# Patient Record
Sex: Female | Born: 1975 | Hispanic: Yes | Smoking: Never smoker
Health system: Southern US, Community
[De-identification: ages and names within clinical notes are randomized; demographics above are authoritative.]

## PROBLEM LIST (undated history)

## (undated) DIAGNOSIS — E119 Type 2 diabetes mellitus without complications: Secondary | ICD-10-CM

## (undated) HISTORY — PX: DILATION AND CURETTAGE OF UTERUS: SHX78

---

## 2001-02-22 ENCOUNTER — Encounter: Payer: Self-pay | Admitting: Unknown Physician Specialty

## 2001-02-22 ENCOUNTER — Ambulatory Visit (HOSPITAL_COMMUNITY): Admission: RE | Admit: 2001-02-22 | Discharge: 2001-02-22 | Payer: Self-pay | Admitting: Unknown Physician Specialty

## 2002-04-22 ENCOUNTER — Ambulatory Visit (HOSPITAL_COMMUNITY): Admission: RE | Admit: 2002-04-22 | Discharge: 2002-04-22 | Payer: Self-pay | Admitting: *Deleted

## 2002-04-22 ENCOUNTER — Encounter: Payer: Self-pay | Admitting: *Deleted

## 2002-08-24 ENCOUNTER — Inpatient Hospital Stay (HOSPITAL_COMMUNITY): Admission: AD | Admit: 2002-08-24 | Discharge: 2002-08-26 | Payer: Self-pay | Admitting: *Deleted

## 2004-10-06 ENCOUNTER — Emergency Department (HOSPITAL_COMMUNITY): Admission: EM | Admit: 2004-10-06 | Discharge: 2004-10-06 | Payer: Self-pay | Admitting: Emergency Medicine

## 2004-10-08 ENCOUNTER — Emergency Department (HOSPITAL_COMMUNITY): Admission: EM | Admit: 2004-10-08 | Discharge: 2004-10-08 | Payer: Self-pay | Admitting: Emergency Medicine

## 2006-12-17 ENCOUNTER — Ambulatory Visit (HOSPITAL_COMMUNITY): Admission: RE | Admit: 2006-12-17 | Discharge: 2006-12-17 | Payer: Self-pay | Admitting: Obstetrics and Gynecology

## 2007-03-25 ENCOUNTER — Other Ambulatory Visit: Admission: RE | Admit: 2007-03-25 | Discharge: 2007-03-25 | Payer: Self-pay | Admitting: Family Medicine

## 2007-04-06 ENCOUNTER — Inpatient Hospital Stay (HOSPITAL_COMMUNITY): Admission: AD | Admit: 2007-04-06 | Discharge: 2007-04-08 | Payer: Self-pay | Admitting: Gynecology

## 2007-04-06 ENCOUNTER — Ambulatory Visit: Payer: Self-pay | Admitting: Family Medicine

## 2008-06-20 ENCOUNTER — Other Ambulatory Visit: Admission: RE | Admit: 2008-06-20 | Discharge: 2008-06-20 | Payer: Self-pay | Admitting: Obstetrics & Gynecology

## 2008-08-04 ENCOUNTER — Ambulatory Visit (HOSPITAL_COMMUNITY): Admission: RE | Admit: 2008-08-04 | Discharge: 2008-08-04 | Payer: Self-pay | Admitting: Obstetrics and Gynecology

## 2008-09-27 ENCOUNTER — Inpatient Hospital Stay (HOSPITAL_COMMUNITY): Admission: AD | Admit: 2008-09-27 | Discharge: 2008-09-28 | Payer: Self-pay | Admitting: Obstetrics & Gynecology

## 2008-09-27 ENCOUNTER — Ambulatory Visit: Payer: Self-pay | Admitting: Family Medicine

## 2010-05-10 ENCOUNTER — Emergency Department (HOSPITAL_COMMUNITY): Admission: EM | Admit: 2010-05-10 | Discharge: 2010-05-10 | Payer: Self-pay | Admitting: Emergency Medicine

## 2010-12-06 NOTE — H&P (Signed)
Rhonda Hawkins, Rhonda Hawkins                         ACCOUNT NO.:  [id number]   MEDICAL RECORD NO.:  [id number]                   PATIENT TYPE:  INP   LOCATION:  LDR1                                 FACILITY:  [facility]   PHYSICIAN:  Langley Gauss, M.D.                DATE OF BIRTH:  [birth year]   DATE OF ADMISSION:  08/24/2002  DATE OF DISCHARGE:                                HISTORY & PHYSICAL   HISTORY OF PRESENT ILLNESS:  This is a 35 year old gravida 4, para 2 at [redacted]  weeks gestation is admitted for induction of labor secondary to psychosocial  factors.  The patient herself speaks minimal [place].  She does have her  sister available for interpreter purposes on a limited basis.  In addition,  the patient has 2 children at home who require child care arrangements.  Her  husband, Rhonda Hawkins, also works until 7 or 8 o'clock each day.  He is at work  at this point in time.  The patient is accompanied to the OB visit by her  sister.   The patient's prenatal course is noted to be uncomplicated.  She does have a  20 week ultrasound which confirms her due date which is also reliable based  upon her last menstrual period.  AFP triple screen within normal limits, B  positive blood type.   PAST OBSTETRICAL HISTORY:  The patient has 2 prior vaginal deliveries,  07/00, girl 7 pounds, 07/99 vaginal delivery of boy, 5 pounds.  She does  have one prior spontaneous AB, 02/03.  Previous care has been in [place].   SOCIAL HISTORY:  The patient is a nonsmoker.  Her sister is named Rhonda Hawkins, who provided the care for previous pregnancy.   CURRENT MEDICATIONS:  Prenatal vitamins only.   ALLERGIES:  The patient has no known drug allergies.  She is unemployed.   PHYSICAL EXAMINATION:  VITAL SIGNS:  Height is 4' 9 1/2, prepregnancy  weight 125, today's weight 154, blood pressure 122/87, pulse rate of 80,  respiratory rate is 20.  HEENT:  Negative.  No adenopathy.  NECK:  Supple, thyroid is nonpalpable.   No adenopathy.  LUNGS:  Clear.  CARDIOVASCULAR:  Regular rate and rhythm.  ABDOMEN:  Soft and nontender.  No surgical scars are identified.  She is a  vertex presentation by Leopold's maneuvers with a fundal height of 36 cm.  EXTREMITIES:  Noted to be normal.  PELVIC:  Normal external genitalia.  No lesions or ulcerations identified.  No leakage of fluid or vaginal bleeding identified.  Cervix noted to be 2 cm  dilated, 80% effaced, vertex is at 0 station and well applied to the cervix.  Fetal heart tones are auscultated at 150.   ASSESSMENT:  A 38 week intrauterine pregnancy in a multiparous patient.  Very favorable  cervix, thus the patient to be admitted  for induction of labor at this time.  We will proceed with amniotomy and  thereafter assess contraction pattern for adequacy and frequency of uterine  contractions.  The patient does plan on bottle-feeding.  Her husband is  planning on having a vasectomy for postpartum birth control purposes.  She  will be utilizing pediatrician on call.                                                Langley Gauss, M.D.    DC/MEDQ  D:  08/24/2002  T:  08/24/2002  Job:  [id number]

## 2010-12-06 NOTE — Op Note (Signed)
NAMEGEORGEANN, Rhonda Hawkins                         ACCOUNT NO.:  [id number]   MEDICAL RECORD NO.:  [id number]                   PATIENT TYPE:  INP   LOCATION:  A419                                 FACILITY:  [facility]   PHYSICIAN:  Langley Gauss, M.D.                DATE OF BIRTH:  [birth year]   DATE OF PROCEDURE:  08/24/2002  DATE OF DISCHARGE:  08/26/2002                                 OPERATIVE REPORT   DELIVERY NOTE   DIAGNOSIS:  A 38 week intrauterine pregnancy presenting in the early stages  of labor.   DELIVERY FORM:  Spontaneously assisted vaginal delivery 6 plus pound female  infant delivered over an intact perineum.   SURGEON:  Langley Gauss, M.D.   ANALGESIA FOR DELIVERY:  The patient received only IV Nubain during the  course of labor.   SPECIMENS:  Arterial cord gas and cord blood to pathology laboratory. The  placenta is examined and noted to be apparently intact with a three vessel  umbilical cord.   SUMMARY:  The patient was seen in the office at which time she was noted to  be 3 cm dilated. She was referred to [facility] at which time she  was noted to be having irregular uterine contractions. Amniotomy is  performed with a fetal scalp electrode, clear amniotic fluid is noted. The  patient had a reassuring fetal heart rate. Thereafter she entered active  labor pattern, requested only IV pain medication during the course of the  labor which provided good relief. She thereafter progressed rapidly along  the labor curve. She was a 9 cm dilatation at which time she had a strong  urge to push. She was managed expectantly and shortly thereafter examined at  which time she was noted to be completely dilated. She was thus placed in  the dorsal lithotomy position, prepped and draped in the usual sterile  manner at which time she pushed very well during her short second stage of  labor. Excellent perineal support is provided. No episiotomy or laceration  is noted to  occur. Following delivery of the head, the mouth and nares were  bulb suctioned of cleared amniotic fluid. Renewed expulsive efforts then  resulted in spontaneous rotation to a left anterior shoulder position.  Gentle down retraction combined with expulsive efforts resulted in delivery  of the anterior shoulder beneath the pubic symphysis without difficulty. The  remainder of the infant also delivered without difficulty and spontaneous  and vigorous breathing and cry is noted. The umbilical cord is then milked  towards the infant, the cord itself is clamped and cut and infant is handed  to the nursing personnel for immediate assessment. Arterial cord gas and  cord blood are then obtained from the umbilical cord. Gentle traction on the  umbilical cord resulted in separation which upon examination appears to be  an intact placenta with an intact three  vessel umbilical  cord. Examination of the genital tract reveals no  lacerations. The perineum is noted to be intact. Excellent uterine tone is  achieved with administration of a little IV Pitocin solution. The patient  tolerated the procedure very well. She was taken out of dorsal lithotomy  position and allowed to bond with the infant.                                                 Langley Gauss, M.D.    DC/MEDQ  D:  08/26/2002  T:  08/27/2002  Job:  [id number]

## 2010-12-06 NOTE — Discharge Summary (Signed)
   Rhonda Hawkins, Rhonda Hawkins                         ACCOUNT NO.:  [id number]   MEDICAL RECORD NO.:  [id number]                   PATIENT TYPE:  INP   LOCATION:  A419                                 FACILITY:  [facility]   PHYSICIAN:  Langley Gauss, M.D.                DATE OF BIRTH:  Sep 13, 1975   DATE OF ADMISSION:  08/24/2002  DATE OF DISCHARGE:  08/26/2002                                 DISCHARGE SUMMARY   DIAGNOSES:  1. Intrauterine pregnancy at 38+ weeks, presenting in labor.  2. Dysfunctional labor pattern with inadequate frequency and intensive     uterine contractions with Pitocin augmentation.  Delivery performed on     08/25/2002, spontaneous assisted vaginal delivery of a viable and vigorous     female infant, delivered over an intact perineum.   DISPOSITION:  AT time of discharge, the patient is given a copy of standard  discharge instructions.  She will follow up in the office in four weeks'  time.  Pertinently, the infant weighed 7 pounds 0.6 ounces, female infant.  The patient, per her request, did not desire infant circumcision to be  performed.   PERTINENT LABORATORY DATA:  Admission hemoglobin 10.6, hematocrit 32.2,  white count 9.5.  On postpartum day #1, hemoglobin 10.2, hematocrit 31.4,  white count 8.6.  B positive blood type.   HOSPITAL COURSE:  See previous dictation.  The patient required Pitocin  augmentation through the course of labor.  She received only IV narcotics  for pain relief. The patient progressed normally in long labor curve to well-  controlled atraumatic spontaneous vaginal delivery.   Postpartum the patient did very well.  She bonded well with the infant, had  no postpartum complications. Thus, she was discharged home on postpartum day  #1-1/2.  Pertinently, the patient was treated with Depo-Provera prior to  discharge.  Her husband will possibly be proceeding with performance of  vasectomy for permanent birth control purposes.                              Langley Gauss, M.D.    DC/MEDQ  D:  08/30/2002  T:  08/30/2002  Job:  [id number]

## 2011-02-07 ENCOUNTER — Observation Stay (HOSPITAL_COMMUNITY): Payer: Self-pay | Admitting: Anesthesiology

## 2011-02-07 ENCOUNTER — Encounter (HOSPITAL_COMMUNITY)
Admission: AD | Disposition: A | Discharge status: Home / Self Care | Payer: Self-pay | Source: Other Acute Inpatient Hospital | Attending: Obstetrics and Gynecology

## 2011-02-07 ENCOUNTER — Other Ambulatory Visit: Payer: Self-pay | Admitting: Obstetrics and Gynecology

## 2011-02-07 ENCOUNTER — Encounter (HOSPITAL_COMMUNITY): Payer: Self-pay | Admitting: Anesthesiology

## 2011-02-07 ENCOUNTER — Inpatient Hospital Stay (HOSPITAL_COMMUNITY)
Admission: AD | Admit: 2011-02-07 | Discharge: 2011-02-08 | DRG: 777 | Disposition: A | Discharge status: Home / Self Care | Payer: Self-pay | Source: Other Acute Inpatient Hospital | Attending: Obstetrics and Gynecology | Admitting: Obstetrics and Gynecology

## 2011-02-07 ENCOUNTER — Encounter (HOSPITAL_COMMUNITY): Payer: Self-pay | Admitting: General Practice

## 2011-02-07 DIAGNOSIS — O00109 Unspecified tubal pregnancy without intrauterine pregnancy: Principal | ICD-10-CM | POA: Diagnosis present

## 2011-02-07 DIAGNOSIS — Z302 Encounter for sterilization: Secondary | ICD-10-CM

## 2011-02-07 HISTORY — PX: LAPAROTOMY: SHX154

## 2011-02-07 HISTORY — PX: TUBAL LIGATION: SHX77

## 2011-02-07 LAB — CBC
Hemoglobin: 12 g/dL (ref 12.0–15.0)
MCHC: 34.8 g/dL (ref 30.0–36.0)
RBC: 4.07 MIL/uL (ref 3.87–5.11)
WBC: 9.2 10*3/uL (ref 4.0–10.5)

## 2011-02-07 LAB — DIFFERENTIAL
Basophils Relative: 0 % (ref 0–1)
Monocytes Relative: 6 % (ref 3–12)
Neutro Abs: 5 10*3/uL (ref 1.7–7.7)
Neutrophils Relative %: 55 % (ref 43–77)

## 2011-02-07 LAB — PREPARE RBC (CROSSMATCH)

## 2011-02-07 LAB — HCG, SERUM, QUALITATIVE: Preg, Serum: POSITIVE — AB

## 2011-02-07 SURGERY — LAPAROTOMY
Anesthesia: General | Wound class: Clean

## 2011-02-07 MED ORDER — GLYCOPYRROLATE 0.2 MG/ML IJ SOLN
INTRAMUSCULAR | Status: DC | PRN
  Administered 2011-02-07: .4 mg via INTRAVENOUS

## 2011-02-07 MED ORDER — HYDROCODONE-ACETAMINOPHEN 5-325 MG PO TABS
1.0000 | ORAL_TABLET | ORAL | Status: DC | PRN
  Administered 2011-02-07 – 2011-02-08 (×4): 2 via ORAL
  Filled 2011-02-07 (×5): qty 2

## 2011-02-07 MED ORDER — ZOLPIDEM TARTRATE 5 MG PO TABS: 10.0000 mg | ORAL_TABLET | Freq: Every day | ORAL | Status: DC

## 2011-02-07 MED ORDER — SUCCINYLCHOLINE CHLORIDE 20 MG/ML IJ SOLN
INTRAMUSCULAR | Status: DC | PRN
  Administered 2011-02-07: 140 mg via INTRAVENOUS

## 2011-02-07 MED ORDER — PANTOPRAZOLE SODIUM 40 MG PO TBEC
40.0000 mg | DELAYED_RELEASE_TABLET | Freq: Every day | ORAL | Status: DC
  Administered 2011-02-07: 40 mg via ORAL
  Filled 2011-02-07: qty 1

## 2011-02-07 MED ORDER — CEFAZOLIN SODIUM 1-5 GM-% IV SOLN
1.0000 g | Freq: Once | INTRAVENOUS | Status: DC
  Filled 2011-02-07: qty 50

## 2011-02-07 MED ORDER — PROPOFOL 10 MG/ML IV EMUL
INTRAVENOUS | Status: DC | PRN
  Administered 2011-02-07: 120 mg via INTRAVENOUS

## 2011-02-07 MED ORDER — PROPOFOL 10 MG/ML IV EMUL
INTRAVENOUS | Status: AC
  Filled 2011-02-07: qty 20

## 2011-02-07 MED ORDER — FENTANYL CITRATE 0.05 MG/ML IJ SOLN
INTRAMUSCULAR | Status: AC
  Filled 2011-02-07: qty 5

## 2011-02-07 MED ORDER — FENTANYL CITRATE 0.05 MG/ML IJ SOLN
INTRAMUSCULAR | Status: DC | PRN
  Administered 2011-02-07: 150 ug via INTRAVENOUS
  Administered 2011-02-07 (×2): 50 ug via INTRAVENOUS

## 2011-02-07 MED ORDER — ROCURONIUM BROMIDE 50 MG/5ML IV SOLN
INTRAVENOUS | Status: AC
  Filled 2011-02-07: qty 1

## 2011-02-07 MED ORDER — BUPIVACAINE-EPINEPHRINE PF 0.5-1:200000 % IJ SOLN
INTRAMUSCULAR | Status: AC
  Filled 2011-02-07: qty 10

## 2011-02-07 MED ORDER — LACTATED RINGERS IV SOLN
INTRAVENOUS | Status: AC
  Administered 2011-02-07: 09:00:00 via INTRAVENOUS

## 2011-02-07 MED ORDER — LIDOCAINE HCL (PF) 1 % IJ SOLN
INTRAMUSCULAR | Status: AC
  Filled 2011-02-07: qty 5

## 2011-02-07 MED ORDER — SODIUM CHLORIDE 0.9 % IJ SOLN
INTRAMUSCULAR | Status: AC
  Administered 2011-02-07: 03:00:00
  Filled 2011-02-07: qty 10

## 2011-02-07 MED ORDER — IBUPROFEN 600 MG PO TABS
600.0000 mg | ORAL_TABLET | Freq: Four times a day (QID) | ORAL | Status: DC | PRN
  Filled 2011-02-07: qty 1

## 2011-02-07 MED ORDER — ZOLPIDEM TARTRATE 5 MG PO TABS: 5.0000 mg | ORAL_TABLET | Freq: Every evening | ORAL | Status: DC | PRN

## 2011-02-07 MED ORDER — MIDAZOLAM HCL 2 MG/2ML IJ SOLN
INTRAMUSCULAR | Status: AC
  Filled 2011-02-07: qty 2

## 2011-02-07 MED ORDER — BUTORPHANOL TARTRATE 1 MG/ML IJ SOLN: 1.0000 mg | INTRAMUSCULAR | Status: DC | PRN

## 2011-02-07 MED ORDER — SUCCINYLCHOLINE CHLORIDE 20 MG/ML IJ SOLN
INTRAMUSCULAR | Status: AC
  Filled 2011-02-07: qty 1

## 2011-02-07 MED ORDER — NALBUPHINE HCL 10 MG/ML IJ SOLN
10.0000 mg | INTRAMUSCULAR | Status: DC | PRN
  Administered 2011-02-07 – 2011-02-08 (×3): 10 mg via INTRAVENOUS
  Filled 2011-02-07 (×4): qty 1

## 2011-02-07 MED ORDER — ONDANSETRON HCL 4 MG/2ML IJ SOLN: 4.0000 mg | Freq: Four times a day (QID) | INTRAMUSCULAR | Status: DC | PRN

## 2011-02-07 MED ORDER — HYDROMORPHONE HCL 1 MG/ML IJ SOLN: 1.0000 mg | Freq: Once | INTRAMUSCULAR | Status: DC

## 2011-02-07 MED ORDER — SODIUM CHLORIDE 0.9 % IV BOLUS (SEPSIS)
500.0000 mL | Freq: Once | INTRAVENOUS | Status: AC
  Administered 2011-02-07: 500 mL via INTRAVENOUS

## 2011-02-07 MED ORDER — KETOROLAC TROMETHAMINE 30 MG/ML IJ SOLN
30.0000 mg | Freq: Once | INTRAMUSCULAR | Status: AC
  Administered 2011-02-07: 30 mg via INTRAVENOUS

## 2011-02-07 MED ORDER — ONDANSETRON HCL 4 MG PO TABS: 4.0000 mg | ORAL_TABLET | Freq: Four times a day (QID) | ORAL | Status: DC | PRN

## 2011-02-07 MED ORDER — GLYCOPYRROLATE 0.2 MG/ML IJ SOLN
INTRAMUSCULAR | Status: AC
  Filled 2011-02-07: qty 1

## 2011-02-07 MED ORDER — NEOSTIGMINE METHYLSULFATE 1 MG/ML IJ SOLN
INTRAMUSCULAR | Status: DC | PRN
  Administered 2011-02-07: 3 mg via INTRAMUSCULAR

## 2011-02-07 MED ORDER — MIDAZOLAM HCL 5 MG/5ML IJ SOLN
INTRAMUSCULAR | Status: DC | PRN
  Administered 2011-02-07: 2 mg via INTRAVENOUS

## 2011-02-07 MED ORDER — SODIUM CHLORIDE 0.9 % IV SOLN
INTRAVENOUS | Status: DC
  Administered 2011-02-07: 02:00:00 via INTRAVENOUS

## 2011-02-07 MED ORDER — LIDOCAINE HCL 1 % IJ SOLN
INTRAMUSCULAR | Status: DC | PRN
  Administered 2011-02-07: 50 mg via INTRADERMAL

## 2011-02-07 MED ORDER — KETOROLAC TROMETHAMINE 30 MG/ML IJ SOLN
30.0000 mg | Freq: Once | INTRAMUSCULAR | Status: AC
  Filled 2011-02-07: qty 1

## 2011-02-07 MED ORDER — ROCURONIUM BROMIDE 100 MG/10ML IV SOLN
INTRAVENOUS | Status: DC | PRN
  Administered 2011-02-07: 30 mg via INTRAVENOUS

## 2011-02-07 MED ORDER — CEFAZOLIN SODIUM 1-5 GM-% IV SOLN
INTRAVENOUS | Status: DC | PRN
  Administered 2011-02-07: 1 g via INTRAVENOUS

## 2011-02-07 MED ORDER — NEOSTIGMINE METHYLSULFATE 1 MG/ML IJ SOLN
INTRAMUSCULAR | Status: AC
  Filled 2011-02-07: qty 10

## 2011-02-07 MED ORDER — BUPIVACAINE-EPINEPHRINE PF 0.5-1:200000 % IJ SOLN
INTRAMUSCULAR | Status: DC | PRN
  Administered 2011-02-07: 20 mL

## 2011-02-07 MED ORDER — KETOROLAC TROMETHAMINE 30 MG/ML IJ SOLN
INTRAMUSCULAR | Status: AC
  Filled 2011-02-07: qty 1

## 2011-02-07 MED ORDER — SODIUM CHLORIDE 0.9 % IJ SOLN
INTRAMUSCULAR | Status: AC
  Administered 2011-02-07: 03:00:00
  Filled 2011-02-07: qty 3

## 2011-02-07 MED ORDER — LACTATED RINGERS IV SOLN
INTRAVENOUS | Status: DC | PRN
  Administered 2011-02-07 (×2): via INTRAVENOUS

## 2011-02-07 MED ORDER — CEFAZOLIN SODIUM 1 G IJ SOLR
INTRAMUSCULAR | Status: AC
  Filled 2011-02-07: qty 10

## 2011-02-07 SURGICAL SUPPLY — 44 items
BAG HAMPER (MISCELLANEOUS) ×3 IMPLANT
CELLS DAT CNTRL 66122 CELL SVR (MISCELLANEOUS) IMPLANT
CLOTH BEACON ORANGE TIMEOUT ST (SAFETY) ×3 IMPLANT
COVER LIGHT HANDLE STERIS (MISCELLANEOUS) ×4 IMPLANT
DRAPE WARM FLUID 44X44 (DRAPE) ×3 IMPLANT
DRESSING TELFA 8X3 (GAUZE/BANDAGES/DRESSINGS) ×3 IMPLANT
DURAPREP 26ML APPLICATOR (WOUND CARE) ×3 IMPLANT
ELECT REM PT RETURN 9FT ADLT (ELECTROSURGICAL) ×3
ELECTRODE REM PT RTRN 9FT ADLT (ELECTROSURGICAL) ×2 IMPLANT
FORMALIN 10 PREFIL 480ML (MISCELLANEOUS) ×3 IMPLANT
GLOVE BIOGEL PI IND STRL 9 (GLOVE) ×1 IMPLANT
GLOVE BIOGEL PI INDICATOR 9 (GLOVE) ×1
GLOVE ECLIPSE 6.5 STRL STRAW (GLOVE) ×3 IMPLANT
GLOVE ECLIPSE 9.0 STRL (GLOVE) ×3 IMPLANT
GLOVE INDICATOR 7.0 STRL GRN (GLOVE) ×3 IMPLANT
GLOVE INDICATOR STER SZ 9 (GLOVE) ×3 IMPLANT
GOWN BRE IMP SLV AUR XL STRL (GOWN DISPOSABLE) ×3 IMPLANT
GOWN STRL REIN 3XL LVL4 (GOWN DISPOSABLE) ×3 IMPLANT
INST SET MAJOR GENERAL (KITS) ×3 IMPLANT
KIT ROOM TURNOVER APOR (KITS) ×3 IMPLANT
MANIFOLD NEPTUNE II (INSTRUMENTS) ×3 IMPLANT
NEEDLE HYPO 25X1 1.5 SAFETY (NEEDLE) ×3 IMPLANT
PACK ABDOMINAL MAJOR (CUSTOM PROCEDURE TRAY) ×3 IMPLANT
PAD ARMBOARD 7.5X6 YLW CONV (MISCELLANEOUS) ×3 IMPLANT
RETRACTOR WND ALEXIS 25 LRG (MISCELLANEOUS) IMPLANT
RTRCTR WOUND ALEXIS 18CM MED (MISCELLANEOUS)
RTRCTR WOUND ALEXIS 25CM LRG (MISCELLANEOUS)
SET BASIN LINEN APH (SET/KITS/TRAYS/PACK) ×3 IMPLANT
SOL PREP PROV IODINE SCRUB 4OZ (MISCELLANEOUS) IMPLANT
SPONGE GAUZE 4X4 12PLY (GAUZE/BANDAGES/DRESSINGS) IMPLANT
STAPLER VISISTAT 35W (STAPLE) ×3 IMPLANT
SUCTION POOLE TIP (SUCTIONS) IMPLANT
SUT CHROMIC 0 CT 1 (SUTURE) ×3 IMPLANT
SUT CHROMIC 2 0 CT 1 (SUTURE) ×6 IMPLANT
SUT PLAIN CT 1/2CIR 2-0 27IN (SUTURE) ×6 IMPLANT
SUT VIC AB 0 CT1 27 (SUTURE) ×3
SUT VIC AB 0 CT1 27XBRD ANTBC (SUTURE) IMPLANT
SUT VIC AB 4-0 PS2 27 (SUTURE) ×3 IMPLANT
SUT VICRYL AB BRD 2-0 54IN (SUTURE) IMPLANT
SYR CONTROL 10ML LL (SYRINGE) ×1 IMPLANT
TOWEL BLUE STERILE X RAY DET (MISCELLANEOUS) ×3 IMPLANT
TOWEL OR 17X26 4PK STRL BLUE (TOWEL DISPOSABLE) ×3 IMPLANT
TRAY FOLEY BAG SILVER LF 14FR (CATHETERS) ×3 IMPLANT
TRAY FOLEY CATH 16FR SILVER (SET/KITS/TRAYS/PACK) IMPLANT

## 2011-02-07 NOTE — Anesthesia Preprocedure Evaluation (Signed)
Anesthesia Evaluation  Name, MR# and DOB Patient awake  General Assessment Comment  Reviewed: Allergy & Precautions and reviewed documented beta blocker date and time   Airway Mallampati: III TM Distance: <3 FB Neck ROM: Full  Mouth opening: Limited Mouth Opening  Dental No notable dental hx (+) Teeth Intact   Pulmonaryneg pulmonary ROS    clear to auscultation  pulmonary exam normal   Cardiovascular Exercise Tolerance: Good Regular Normal   Neuro/PsychNegative Neurological ROS Negative Psych ROS  GI/Hepatic/Renal negative Liver ROS, and negative Renal ROS (+)  GERD Controlled     Endo/Other  Negative Endocrine ROS (+)   Abdominal Normal abdominal exam  (+)  Abdomen: soft. Bowel sounds: normal.  Musculoskeletal negative musculoskeletal ROS (+)  Hematology negative hematology ROS (+)   Peds negative pediatric ROS (+)  Reproductive/Obstetrics negative OB ROS   Anesthesia Other Findings             Anesthesia Physical Anesthesia Plan  ASA: II and Emergent  Anesthesia Plan: General   Post-op Pain Management:    Induction: Intravenous, Rapid sequence and Cricoid pressure planned  Airway Management Planned: Oral ETT  Additional Equipment:   Intra-op Plan:   Post-operative Plan: Extubation in OR  Informed Consent: I have reviewed the patients History and Physical, chart, labs and discussed the procedure including the risks, benefits and alternatives for the proposed anesthesia with the patient or authorized representative who has indicated his/her understanding and acceptance.   Dental advisory given  Plan Discussed with: CRNA, Anesthesiologist ([facility]) and Surgeon  Anesthesia Plan Comments: (glidesdcope avaible)        Anesthesia Quick Evaluation

## 2011-02-07 NOTE — Progress Notes (Signed)
02/07/11 0408 Patient sent to OR in stable condition.  VSS.  No problems noted.

## 2011-02-07 NOTE — Brief Op Note (Signed)
02/07/2011  5:35 AM  PATIENT:  Rhonda Hawkins  35 y.o. female  PRE-OPERATIVE DIAGNOSIS:  ectopic tubal pregnancy  POST-OPERATIVE DIAGNOSIS:  ectopic tubal pregnancy  PROCEDURE:  Procedure(s): LAPAROTOMY BILATERAL TUBAL LIGATION  SURGEON:  Surgeon(s): Burnard Bunting  PHYSICIAN ASSISTANT:   ASSISTANTS: na    ANESTHESIA:   general  ESTIMATED BLOOD LOSS: * No blood loss amount entered *   BLOOD ADMINISTERED:none  DRAINS: none   LOCAL MEDICATIONS USED:  MARCAINE 20CC  SPECIMEN:  Source of Specimen:  tubes  DISPOSITION OF SPECIMEN:  PATHOLOGY  COUNTS:  YES  TOURNIQUET:  * No tourniquets in log *  DICTATION #:   PLAN OF CARE:   PATIENT DISPOSITION:  PACU - hemodynamically stable.   Delay start of Pharmacological VTE agent (>24hrs) due to surgical blood loss or risk of bleeding:  yes        02/07/2011  5:21 AM  PATIENT:  Rhonda Hawkins  35 y.o. female  PRE-OPERATIVE DIAGNOSIS:  ectopic tubal pregnancy, right,                                                      desire for permanent sterilization  POST-OPERATIVE DIAGNOSIS:  ectopic tubal pregnancy desire for permanent sterilization  PROCEDURE:  Procedure(s): LAPAROTOMY, Right salpingectomy for ectopic, left salpingectomy(sterilization)  SURGEON:  Surgeon(s): Burnard Bunting  PHYSICIAN ASSISTANT:   ASSISTANTS: Cecile Sheerer, RN FA   ANESTHESIA:   general  ESTIMATED BLOOD LOSS: * No blood loss amount entered *   BLOOD ADMINISTERED:none  DRAINS: none   LOCAL MEDICATIONS USED:  MARCAINE 20CC  SPECIMEN:  Source of Specimen:  bilateral fallopian tubes  DISPOSITION OF SPECIMEN:  PATHOLOGY  COUNTS:  YES  TOURNIQUET:  * No tourniquets in log *  DICTATION #: [id number]  PLAN OF CARE: admit x 24 hours  PATIENT DISPOSITION:  PACU - hemodynamically stable.   Delay start of Pharmacological VTE agent (>24hrs) due to surgical blood loss or risk of bleeding:   yes Early ambulation planned.

## 2011-02-07 NOTE — Transfer of Care (Signed)
Immediate Anesthesia Transfer of Care Note  Patient: Rhonda Hawkins  Procedure(s) Performed:  LAPAROTOMY  Patient Location: PACU  Anesthesia Type: General  Level of Consciousness: sedated  Airway & Oxygen Therapy: Patient Spontanous Breathing and Patient connected to face mask oxygen  Post-op Assessment: Report given to PACU RN, Post -op Vital signs reviewed and stable and Patient moving all extremities  Post vital signs: stable  Complications: No apparent anesthesia complications

## 2011-02-07 NOTE — Op Note (Signed)
NAMECHARO, Rhonda Hawkins             ACCOUNT NO.:  [id number]  MEDICAL RECORD NO.:  [id number]  LOCATION:  A320                          FACILITY:  [facility]  PHYSICIAN:  Tilda Burrow, M.D. DATE OF BIRTH:  18-Dec-1975  DATE OF PROCEDURE:  02/07/2011 DATE OF DISCHARGE:                              OPERATIVE REPORT   PREOPERATIVE DIAGNOSES:  Right ectopic pregnancy, desire for elective permanent sterilization.  PROCEDURE:  Right salpingectomy for ectopic, left salpingectomy sterilization (bilateral salpingectomy).  SURGEON:  Tilda Burrow, MD  ASSISTANT:  Dr. Annabell Howells  ANESTHESIA:  General.  COMPLICATIONS:  None.  FINDINGS:  300 mL of blood and clots inside the pelvis, right ectopic pregnancy with ongoing bleeding from distal tube, normal ovaries bilaterally.  INDICATIONS:  A 35 year old with gravida 7, para 5-0-1-5 with a positive HCG 8000 and ultrasound confirmation of extrauterine pregnancy at [facility], the patient of [facility] transferred here for evaluation.  Upon arrival, the patient's blood pressures had changed from reported values at [facility], when combined with pain and was taken promptly to the OR.  DETAILS OF PROCEDURE:  The patient was taken to the operating room after consents obtained and confirmed with a translator service over the phone.  Permanent sterilization was specifically requested and initiated by the patient.  Permanency of sterilization requested and reviewed with the patient.  Procedure risks and potential complications were reviewed through a translator.  The patient had time-out conducted in the room, Ancef 1 g IV administered and then the lower abdominal transverse incision made approximately 10 cm in length in method of Pfannenstiel.  The fascia was opened transversely, peritoneal cavity opened and entered bluntly in the midline.  Generous amount of blood and clots was encountered and sucked and suctioned out.  The patient had an  easily identifiable distended isthmic midportion of the right tube involved in ectopic pregnancy. Babcock clamp was used to elevate the tube.  The salpingectomy was performed using Kelly clamps, transecting the mesosalpinx and then ligating it in segments using 2-0 chromic.  Tubal stump at the insertion into the uterine body was crossclamped with a Kelly clamp and ligated. Hemostasis was good.  On the left side, since the patient desired permanent sterilization tube was inspected.  There was some irregularity to the distal fimbria on that side.  It almost looked like there had been a previous damage to the distal fimbria on that side.  In order to avoid potential complications such as hydrosalpinx, a salpingectomy was then performed.  Similar technique was used on the left side. Hemostasis was again satisfactory.  Ovaries were grossly normal.  Pelvis was irrigated with 1000 mL of saline solution and cleared significantly. Anterior laparotomy equipment was removed, anterior peritoneum closed with some difficulty using 2-0 chromic, then the fascia closed with running 0 Vicryl and then a single subcutaneous 2-0 Vicryl suture placed in the fatty tissue for reapproximation and then subcuticular 4-0 Vicryl closure of the skin incision performed.  Sponge and needle counts correct.  The patient tolerated the procedure well and went to recovery room.  Foley catheter was left in place.     Tilda Burrow, M.D.     JVF/MEDQ  D:  02/07/2011  T:  02/07/2011  Job:  [id number]

## 2011-02-07 NOTE — Anesthesia Procedure Notes (Signed)
Procedure Name: Intubation Date/Time: 02/07/2011 4:20 AM Performed by: Despina Hidden Pre-anesthesia Checklist: Patient identified, Emergency Drugs available, Suction available, Timeout performed and Patient being monitored Patient Re-evaluated:Patient Re-evaluated prior to inductionOxygen Delivery Method: Circle System Utilized Preoxygenation: Pre-oxygenation with 100% oxygen Intubation Type: IV induction, Rapid sequence and Circoid Pressure applied Ventilation: Mask ventilation without difficulty Laryngoscope Size: Mac and 3 Grade View: Grade I Tube type: Oral Tube size: 7.0 mm Number of attempts: 1 Airway Equipment and Method: stylet Placement Confirmation: ETT inserted through vocal cords under direct vision,  positive ETCO2 and breath sounds checked- equal and bilateral Secured at: 22 cm Tube secured with: Tape Dental Injury: Teeth and Oropharynx as per pre-operative assessment

## 2011-02-07 NOTE — H&P (Signed)
Subjective:    Patient is a 35 y.o. female scheduled for laparotomy for removal of right ectopic, and for left tubal  ligation. Indications for procedure are that right ectopic has been diagnosed, and patient specifically requests permanent sterilization at time of surgery.  She is a G7 [id number]  Onset of symptoms was 1 dayago. Symptoms have been gradually worsening since that time. The pain is located in the right lower abdomen. Patient describes the pain as 8 of 10, continuous and rated as severe. Pain has been associated with normal activities. Patient denies any food since 6 PM on 7/19. Symptoms are aggravated by movement. Symptoms improve with n/a. Past history includes 5 prior healthy pregnancies. Previous studies include ultrasound at [facility] revealing an empty uterus, and a 3+ cm mass in right adnexa with some free fluid.  Vital signs have changed since referrral values. Pulse is now in high 90's, and last BP 120/60's , changing from prior values.  Hct 34, was 39 at [facility]  Pertinent Gynecological History: No prior gyn problems  Menses: pregnant Bleeding: light vag bleeding tonite Contraception: none DES exposure: denies Blood transfusions: none Sexually transmitted diseases: no past history Preventive screening: n/a Last mammogram: n/a Date: n/a Last pap: normal Date : n/a  Discussed Blood/Blood Products: yes  Menstrual History: OB History    Grav Para Term Preterm Abortions TAB SAB Ect Mult Living                  Menarche age: n/a Patient's last menstrual period was 12/16/2010.      Review of Systems Pertinent items are noted in HPI.    Objective:    LMP 12/16/2010  General:   alert and mild distress  Skin:   normal  HEENT:  PERRLA  Lungs:   clear to auscultation bilaterally  Heart:   regular rate and rhythm  Breasts:   normal without suspicious masses, skin or nipple changes or axillary nodes, self-exam is taught and encouraged and defer  Abdomen:   abnormal findings:  guarding  Pelvis:  Exam deferred.  Ultrasound shows empty uterus and right adnexal mass and free fluid       FHT: n/a BPM  Uterine Size: n/a  Presentation: na  Cervix:             Dilation: na      Effacement: na            Station:  na    Consistency: na           Position: na     Assessment: Right ectopic pregnancy, leaking or ruptured, with bleeding . Desire for permanent sterilization Desire for      To operating room for right salpingectomy, left tubal ligation      Plan: to OR tonight    Counseling: Procedure, risks, reasons, benefits and complications (including injury to bowel, bladder, major blood vessel, ureter, bleeding, possibility of transfusion, infection, or fistula formation) reviewed in detail. Consent signed. Preop testing ordered. Instructions reviewed, including NPO after 6 pm yesterday.

## 2011-02-07 NOTE — Plan of Care (Signed)
Problem: Consults Goal: General Medical Patient Education See Patient Education Module for specific education. Outcome: Progressing Ectopic pregnancy

## 2011-02-07 NOTE — Brief Op Note (Signed)
02/07/2011  5:21 AM  PATIENT:  Rhonda Hawkins  35 y.o. female  PRE-OPERATIVE DIAGNOSIS:  ectopic tubal pregnancy, right,                                                      desire for permanent sterilization  POST-OPERATIVE DIAGNOSIS:  ectopic tubal pregnancy desire for permanent sterilization  PROCEDURE:  Procedure(s): LAPAROTOMY, Right salpingectomy for ectopic, left salpingectomy(sterilization)  SURGEON:  Surgeon(s): Burnard Bunting  PHYSICIAN ASSISTANT:   ASSISTANTS: Cecile Sheerer, RN FA   ANESTHESIA:   general  ESTIMATED BLOOD LOSS: * No blood loss amount entered *   BLOOD ADMINISTERED:none  DRAINS: none   LOCAL MEDICATIONS USED:  MARCAINE 20CC  SPECIMEN:  Source of Specimen:  bilateral fallopian tubes  DISPOSITION OF SPECIMEN:  PATHOLOGY  COUNTS:  YES  TOURNIQUET:  * No tourniquets in log *  DICTATION #: [id number]  PLAN OF CARE: admit x 24 hours  PATIENT DISPOSITION:  PACU - hemodynamically stable.   Delay start of Pharmacological VTE agent (>24hrs) due to surgical blood loss or risk of bleeding:  yes Early ambulation planned.

## 2011-02-07 NOTE — Anesthesia Postprocedure Evaluation (Signed)
  Anesthesia Post-op Note  Patient: Rhonda Hawkins  Procedure(s) Performed:  LAPAROTOMY; BILATERAL TUBAL LIGATION - Bilateral salpingectomy  Patient Location: PACU  Anesthesia Type: General  Level of Consciousness: awake, oriented and patient cooperative  Airway and Oxygen Therapy: Patient Spontanous Breathing and Patient connected to face mask  Post-op Pain: mild  Post-op Assessment: Post-op Vital signs reviewed, Patient's Cardiovascular Status Stable, Respiratory Function Stable, Patent Airway and No signs of Nausea or vomiting  Post-op Vital Signs: stable  Complications: No apparent anesthesia complications

## 2011-02-08 MED ORDER — HYDROCODONE-ACETAMINOPHEN 5-325 MG PO TABS: 1.0000 | ORAL_TABLET | ORAL | Status: AC | PRN

## 2011-02-08 NOTE — Progress Notes (Signed)
1 Day Post-Op Procedure(s): LAPAROTOMY  For ectopic with Right Salpingectomy for ectopic, left salpingectomy for requested sterilization  BILATERAL TUBAL LIGATION  Subjective: Patient reports tolerating PO, + flatus and no problems voiding.    Objective: I have reviewed patient's vital signs.BP 96/63  Pulse 86  Temp(Src) 98.3 F (36.8 C) (Oral)  Resp 18  Ht 5\' 1"  (1.549 m)  Wt 161 lb 9.6 oz (73.3 kg)  BMI 30.53 kg/m2  SpO2 96%  LMP 12/16/2010   abdomen soft, incision intact, no erythema, no distention  Assessment: s/p Procedure(s):Right Salpingectomy for ectopic, Left salpingectomy for sterilization via LAPAROTOMY BILATERAL TUBAL LIGATION: stable  Plan: Discharge home  LOS: 1 day    Dezmen Alcock V 02/08/2011, 10:19 AM

## 2011-02-08 NOTE — Discharge Summary (Signed)
  Patient with uncomplicated postop course after surgery.  See postop day one progress note for detail.

## 2011-02-08 NOTE — Discharge Summary (Signed)
  Discharge Instructions  The hospital and staff members extend their best wishes to you as you are discharged. Because we are most concerned with your health, we suggest you carefully read the following instructions.  Activity Instructions    Restrictions: no heavy lifting x 2 weeks over 15 lbs    Diet: regular  Urination:   Drain/Tubes:   Bowels:      Pain Assessment  Pain Score:   1  Duration:    Current pain management regimen- Instructed: yes  IF PAIN INTENSIFIES OR PAIN IS UNRELIEVED WITH MEDICATIONS AS ORDERED, CALL YOUR DOCTOR - Instructed: yes [phone] ____________________________________________________________________________  Remember to contact your doctor for follow-up as instructed  Follow-up to Dr . Emelda Fear in 2 week  Check with your doctor regarding any labs, xrays, or studies that have not received final results. If you have problems related to your current illness/procedures or if your symptoms persist or worsen, please contact your doctor. ____________________________________________________________________________  Individual Instructions/Custom Documents/Teaching Sheet Use this area to list any individualized instructions, appointment, continued therapies, etc, that the patient/family is to continue after discharge. If additional space is needed, use "Additional Instructions"  Additional Instructions related to continuing or unresolved problems  Weight Management is important to your health. Call your physician if you experience sudden weight loss or weight gain.  Smoking is hazardous to your health. Second hand smoke is hazardous to those around you. If you smoke, you can contact your physician for smoking cessation information and classes.  IV Sites - notify your doctor for any extreme redness or observed drainage from any old IV site.

## 2011-02-08 NOTE — Progress Notes (Signed)
PIV removed without complaint, patient hemodynamically stable upon discharge. Patient verbalizes understanding of discharge instructions, prescriptions and follow up appointments. Patient escorted out by CNA, transported by family.

## 2011-02-10 LAB — TYPE AND SCREEN: Unit division: 0

## 2011-02-11 NOTE — Op Note (Signed)
See dictation [id number] noted in brief op note

## 2011-02-17 ENCOUNTER — Encounter (HOSPITAL_COMMUNITY): Payer: Self-pay | Admitting: Obstetrics and Gynecology

## 2011-05-01 LAB — URINALYSIS, ROUTINE W REFLEX MICROSCOPIC
Glucose, UA: NEGATIVE
Leukocytes, UA: NEGATIVE
Protein, ur: NEGATIVE
Specific Gravity, Urine: 1.01
pH: 7

## 2011-05-01 LAB — RPR: RPR Ser Ql: NONREACTIVE

## 2011-05-01 LAB — URINE MICROSCOPIC-ADD ON

## 2011-05-01 LAB — CBC
MCHC: 33.8
RBC: 4.54

## 2013-01-04 ENCOUNTER — Emergency Department (HOSPITAL_COMMUNITY)
Admission: EM | Admit: 2013-01-04 | Discharge: 2013-01-04 | Disposition: A | Discharge status: Home / Self Care | Payer: Self-pay | Attending: Emergency Medicine | Admitting: Emergency Medicine

## 2013-01-04 ENCOUNTER — Encounter (HOSPITAL_COMMUNITY): Payer: Self-pay | Admitting: Emergency Medicine

## 2013-01-04 DIAGNOSIS — J029 Acute pharyngitis, unspecified: Secondary | ICD-10-CM | POA: Insufficient documentation

## 2013-01-04 DIAGNOSIS — R509 Fever, unspecified: Secondary | ICD-10-CM | POA: Insufficient documentation

## 2013-01-04 DIAGNOSIS — R52 Pain, unspecified: Secondary | ICD-10-CM | POA: Insufficient documentation

## 2013-01-04 DIAGNOSIS — R000 Tachycardia, unspecified: Secondary | ICD-10-CM | POA: Insufficient documentation

## 2013-01-04 DIAGNOSIS — R21 Rash and other nonspecific skin eruption: Secondary | ICD-10-CM | POA: Insufficient documentation

## 2013-01-04 LAB — RAPID STREP SCREEN (MED CTR MEBANE ONLY): Streptococcus, Group A Screen (Direct): NEGATIVE

## 2013-01-04 MED ORDER — PREDNISONE 10 MG PO TABS: ORAL_TABLET | ORAL | Status: DC

## 2013-01-04 MED ORDER — DIPHENHYDRAMINE HCL 25 MG PO TABS: 25.0000 mg | ORAL_TABLET | Freq: Four times a day (QID) | ORAL | Status: DC

## 2013-01-04 MED ORDER — LORATADINE 10 MG PO TABS
10.0000 mg | ORAL_TABLET | Freq: Every day | ORAL | Status: DC
  Administered 2013-01-04: 10 mg via ORAL
  Filled 2013-01-04: qty 1

## 2013-01-04 MED ORDER — PREDNISONE 20 MG PO TABS
40.0000 mg | ORAL_TABLET | Freq: Once | ORAL | Status: AC
  Administered 2013-01-04: 40 mg via ORAL
  Filled 2013-01-04: qty 2

## 2013-01-04 NOTE — ED Provider Notes (Signed)
Medical screening examination/treatment/procedure(s) were performed by non-physician practitioner and as supervising physician I was immediately available for consultation/collaboration.  Angeleigh Chiasson R. Shelbia Scinto, MD 01/04/13 2323 

## 2013-01-04 NOTE — ED Notes (Signed)
Itching rash to thighs, for 2 weeks.  No new soaps or detergent.  Face  And scalp itch, but no rash present.

## 2013-01-04 NOTE — ED Provider Notes (Signed)
History     CSN: [id number]  Arrival date & time 01/04/13  1627   First MD Initiated Contact with Patient 01/04/13 1800      Chief Complaint  Patient presents with  . Rash    (Consider location/radiation/quality/duration/timing/severity/associated sxs/prior treatment) Patient is a 37 y.o. female presenting with rash. The history is provided by the patient.  Rash Pain location:  Generalized Associated symptoms: fever and sore throat   Associated symptoms: no chest pain, no nausea, no shortness of breath and no vomiting    Rhonda Hawkins is a 37 y.o. female who presents to the ED with a rash. The rash has been ongoing x 2 weeks. The itching has gotten really bad. Last night she started fever and sore throat. She denies nausea, vomiting or other problems.  History reviewed. No pertinent past medical history.  Past Surgical History  Procedure Laterality Date  . Dilation and curettage of uterus    . Laparotomy  02/07/2011    Procedure: LAPAROTOMY;  Surgeon: Tilda Burrow, MD;  Location: [facility];  Service: Gynecology;  Laterality: N/A;  . Tubal ligation  02/07/2011    Procedure: BILATERAL TUBAL LIGATION;  Surgeon: Tilda Burrow, MD;  Location: [facility];  Service: Gynecology;  Laterality: Bilateral;  Bilateral salpingectomy    Family History  Problem Relation Age of Onset  . Anesthesia problems Mother     difficulty awakening after anesthesia  . Anesthesia problems Sister     nausea and vomiting    History  Substance Use Topics  . Smoking status: Never Smoker   . Smokeless tobacco: Not on file  . Alcohol Use: No    OB History   Grav Para Term Preterm Abortions TAB SAB Ect Mult Living                  Review of Systems  Constitutional: Positive for fever. Negative for appetite change.  HENT: Positive for sore throat. Negative for congestion, trouble swallowing and neck pain.   Eyes: Negative for pain.  Respiratory: Negative for chest tightness and shortness of  breath.   Cardiovascular: Negative for chest pain.  Gastrointestinal: Negative for nausea, vomiting and abdominal pain.  Skin: Positive for rash.  Allergic/Immunologic: Negative for immunocompromised state.  Neurological: Negative for headaches.  Psychiatric/Behavioral: The patient is not nervous/anxious.     Allergies  Review of patient's allergies indicates no known allergies.  Home Medications   Current Outpatient Rx  Name  Route  Sig  Dispense  Refill  . acetaminophen (TYLENOL) 500 MG tablet   Oral   Take 1,500 mg by mouth every 4 (four) hours as needed. For pain         . Bisacodyl (LAXATIVE PO)   Oral   Take 1 tablet by mouth every 4 (four) hours. For constipation         . ibuprofen (ADVIL,MOTRIN) 200 MG tablet   Oral   Take 200 mg by mouth every 6 (six) hours as needed. For pain            BP 133/76  Pulse 114  Temp(Src) 98.2 F (36.8 C)  Resp 18  Wt 154 lb (69.854 kg)  BMI 29.11 kg/m2  SpO2 100%  LMP 12/05/2012  Physical Exam  Nursing note and vitals reviewed. Constitutional: She is oriented to person, place, and time. She appears well-developed and well-nourished. No distress.  HENT:  Head: Normocephalic and atraumatic.  Eyes: EOM are normal.  Neck: Normal range of  motion. Neck supple.  Cardiovascular: Tachycardia present.   Pulmonary/Chest: Effort normal and breath sounds normal. She has no wheezes.  Abdominal: Soft. There is no tenderness.  Musculoskeletal: Normal range of motion.  Lymphadenopathy:    She has no cervical adenopathy.  Neurological: She is alert and oriented to person, place, and time. No cranial nerve deficit.  Skin: Rash noted.  There is a rash noted on the arms, legs, buttocks and trunk. The patient is scratching continuously. There are hive like areas noted and a few papular areas.   Psychiatric: She has a normal mood and affect. Her behavior is normal.   Results for orders placed during the hospital encounter of 01/04/13  (from the past 24 hour(s))  RAPID STREP SCREEN     Status: None   Collection Time    01/04/13  6:05 PM      Result Value Range   Streptococcus, Group A Screen (Direct) NEGATIVE  NEGATIVE    ED Course  Procedures (including critical care time)   MDM  37 y.o. female with generalized rash unknown cause most likely allergic dermitis. Will treat with prednisone and Benadryl. She will follow up with dermatology if symptoms persist. Discussed with patient in detail to be observant of anything she may eat that causes the rash to get worse or anything new that causes itching. Patient voices understanding.  Discussed with the patient lab and clinical findings and plan of care. All questioned fully answered. She will return if any problems arise.    Medication List    TAKE these medications       diphenhydrAMINE 25 MG tablet  Commonly known as:  BENADRYL  Take 1 tablet (25 mg total) by mouth every 6 (six) hours.     predniSONE 10 MG tablet  Commonly known as:  DELTASONE  Take two tablets by mouth two times a day starting tomorrow. (01/05/13)      ASK your doctor about these medications       acetaminophen 500 MG tablet  Commonly known as:  TYLENOL  Take 1,000 mg by mouth every 4 (four) hours as needed. For pain     ibuprofen 200 MG tablet  Commonly known as:  ADVIL,MOTRIN  Take 200 mg by mouth every 6 (six) hours as needed. For pain            Janne Napoleon, NP 01/04/13 1906

## 2013-01-04 NOTE — ED Notes (Signed)
Pt c/o generalized itching rash x 2 weeks.

## 2014-04-27 ENCOUNTER — Ambulatory Visit: Payer: Self-pay

## 2014-10-10 ENCOUNTER — Emergency Department (HOSPITAL_COMMUNITY): Payer: Self-pay

## 2014-10-10 ENCOUNTER — Emergency Department (HOSPITAL_COMMUNITY)
Admission: EM | Admit: 2014-10-10 | Discharge: 2014-10-10 | Disposition: A | Discharge status: Home / Self Care | Payer: Self-pay | Attending: Emergency Medicine | Admitting: Emergency Medicine

## 2014-10-10 ENCOUNTER — Encounter (HOSPITAL_COMMUNITY): Payer: Self-pay

## 2014-10-10 DIAGNOSIS — N201 Calculus of ureter: Secondary | ICD-10-CM | POA: Insufficient documentation

## 2014-10-10 DIAGNOSIS — Z9851 Tubal ligation status: Secondary | ICD-10-CM | POA: Insufficient documentation

## 2014-10-10 DIAGNOSIS — Z3202 Encounter for pregnancy test, result negative: Secondary | ICD-10-CM | POA: Insufficient documentation

## 2014-10-10 LAB — CBC WITH DIFFERENTIAL/PLATELET
Basophils Absolute: 0 10*3/uL (ref 0.0–0.1)
Basophils Relative: 1 % (ref 0–1)
EOS ABS: 0.1 10*3/uL (ref 0.0–0.7)
EOS PCT: 2 % (ref 0–5)
HCT: 37.2 % (ref 36.0–46.0)
Hemoglobin: 11.5 g/dL — ABNORMAL LOW (ref 12.0–15.0)
LYMPHS ABS: 2.2 10*3/uL (ref 0.7–4.0)
Lymphocytes Relative: 37 % (ref 12–46)
MCH: 20.7 pg — AB (ref 26.0–34.0)
MCHC: 30.9 g/dL (ref 30.0–36.0)
MCV: 66.9 fL — AB (ref 78.0–100.0)
MONO ABS: 0.5 10*3/uL (ref 0.1–1.0)
MONOS PCT: 9 % (ref 3–12)
NEUTROS PCT: 53 % (ref 43–77)
Neutro Abs: 3.2 10*3/uL (ref 1.7–7.7)
Platelets: 233 10*3/uL (ref 150–400)
RBC: 5.56 MIL/uL — AB (ref 3.87–5.11)
RDW: 16.5 % — ABNORMAL HIGH (ref 11.5–15.5)
WBC: 6 10*3/uL (ref 4.0–10.5)

## 2014-10-10 LAB — COMPREHENSIVE METABOLIC PANEL
ALK PHOS: 70 U/L (ref 39–117)
ALT: 29 U/L (ref 0–35)
ANION GAP: 9 (ref 5–15)
AST: 28 U/L (ref 0–37)
Albumin: 4.3 g/dL (ref 3.5–5.2)
BUN: 13 mg/dL (ref 6–23)
CALCIUM: 8.5 mg/dL (ref 8.4–10.5)
CO2: 21 mmol/L (ref 19–32)
Chloride: 107 mmol/L (ref 96–112)
Creatinine, Ser: 0.55 mg/dL (ref 0.50–1.10)
GLUCOSE: 300 mg/dL — AB (ref 70–99)
POTASSIUM: 3.6 mmol/L (ref 3.5–5.1)
SODIUM: 137 mmol/L (ref 135–145)
TOTAL PROTEIN: 7.8 g/dL (ref 6.0–8.3)
Total Bilirubin: 0.5 mg/dL (ref 0.3–1.2)

## 2014-10-10 LAB — URINALYSIS, ROUTINE W REFLEX MICROSCOPIC
Bilirubin Urine: NEGATIVE
GLUCOSE, UA: 500 mg/dL — AB
Ketones, ur: NEGATIVE mg/dL
LEUKOCYTES UA: NEGATIVE
NITRITE: NEGATIVE
PH: 5.5 (ref 5.0–8.0)
PROTEIN: NEGATIVE mg/dL
UROBILINOGEN UA: 0.2 mg/dL (ref 0.0–1.0)

## 2014-10-10 LAB — LIPASE, BLOOD: Lipase: 52 U/L (ref 11–59)

## 2014-10-10 LAB — PREGNANCY, URINE: Preg Test, Ur: NEGATIVE

## 2014-10-10 LAB — URINE MICROSCOPIC-ADD ON

## 2014-10-10 MED ORDER — ONDANSETRON HCL 4 MG/2ML IJ SOLN
4.0000 mg | Freq: Once | INTRAMUSCULAR | Status: AC
  Administered 2014-10-10: 4 mg via INTRAVENOUS
  Filled 2014-10-10: qty 2

## 2014-10-10 MED ORDER — KETOROLAC TROMETHAMINE 30 MG/ML IJ SOLN
30.0000 mg | Freq: Once | INTRAMUSCULAR | Status: AC
  Administered 2014-10-10: 30 mg via INTRAVENOUS
  Filled 2014-10-10: qty 1

## 2014-10-10 MED ORDER — MORPHINE SULFATE 4 MG/ML IJ SOLN
4.0000 mg | Freq: Once | INTRAMUSCULAR | Status: AC
  Administered 2014-10-10: 4 mg via INTRAVENOUS
  Filled 2014-10-10: qty 1

## 2014-10-10 MED ORDER — SODIUM CHLORIDE 0.9 % IV BOLUS (SEPSIS)
1000.0000 mL | Freq: Once | INTRAVENOUS | Status: AC
  Administered 2014-10-10: 1000 mL via INTRAVENOUS

## 2014-10-10 MED ORDER — OXYCODONE-ACETAMINOPHEN 5-325 MG PO TABS: 1.0000 | ORAL_TABLET | ORAL | Status: DC | PRN

## 2014-10-10 NOTE — ED Provider Notes (Signed)
CSN: [id number]     Arrival date & time 10/10/14  1007 History  This chart was scribed for Rhonda Gambler, MD by Zola Button, ED Scribe. This patient was seen in room [facility] and the patient's care was started at 10:38 AM.       Chief Complaint  Patient presents with  . Flank Pain   The history is provided by the patient and the spouse. A language interpreter was used.   HPI Comments: Rhonda Hawkins is a 39 y.o. female who presents to the Emergency Department complaining of sudden onset, constant, severe, left flank pain that started around 9:00 AM this morning. Patient reports having associated nausea and back pain. She denies fever, diarrhea, vomiting, vaginal bleeding, dysuria and hematuria. She also denies hx of kidney stones. Patient does note that she had similar pain a long time ago, but she is unsure what the reason was.    History reviewed. No pertinent past medical history. Past Surgical History  Procedure Laterality Date  . Dilation and curettage of uterus    . Laparotomy  02/07/2011    Procedure: LAPAROTOMY;  Surgeon: Jonnie Kind, MD;  Location: [facility];  Service: Gynecology;  Laterality: N/A;  . Tubal ligation  02/07/2011    Procedure: BILATERAL TUBAL LIGATION;  Surgeon: Jonnie Kind, MD;  Location: [facility];  Service: Gynecology;  Laterality: Bilateral;  Bilateral salpingectomy   Family History  Problem Relation Age of Onset  . Anesthesia problems Mother     difficulty awakening after anesthesia  . Anesthesia problems Sister     nausea and vomiting   History  Substance Use Topics  . Smoking status: Never Smoker   . Smokeless tobacco: Not on file  . Alcohol Use: No   OB History    No data available     Review of Systems  Constitutional: Negative for fever.  Gastrointestinal: Positive for nausea. Negative for vomiting and diarrhea.  Genitourinary: Positive for flank pain. Negative for dysuria, hematuria and vaginal bleeding.  Musculoskeletal: Positive  for back pain.  All other systems reviewed and are negative.     Allergies  Review of patient's allergies indicates no known allergies.  Home Medications   Prior to Admission medications   Medication Sig Start Date End Date Taking? Authorizing Provider  acetaminophen (TYLENOL) 500 MG tablet Take 1,000 mg by mouth every 4 (four) hours as needed. For pain   Yes Historical Provider, MD  diphenhydrAMINE (BENADRYL) 25 MG tablet Take 1 tablet (25 mg total) by mouth every 6 (six) hours. 01/04/13  Yes Hope Bunnie Pion, NP  ibuprofen (ADVIL,MOTRIN) 200 MG tablet Take 200 mg by mouth every 6 (six) hours as needed. For pain    Yes Historical Provider, MD  predniSONE (DELTASONE) 10 MG tablet Take two tablets by mouth two times a day starting tomorrow. (01/05/13) Patient not taking: Reported on 10/10/2014 01/04/13   [name], NP   BP 114/80 mmHg  Pulse 98  Temp(Src) 97.8 F (36.6 C)  Resp 20  SpO2 100%  LMP 09/11/2014 Physical Exam  Constitutional: She is oriented to person, place, and time. She appears well-developed and well-nourished. No distress.  Uncomfortable, laying on side and crying in pain  HENT:  Head: Normocephalic and atraumatic.  Mouth/Throat: Oropharynx is clear and moist. No oropharyngeal exudate.  Eyes: Right eye exhibits no discharge. Left eye exhibits no discharge.  Neck: Neck supple.  Cardiovascular: Normal rate, regular rhythm and normal heart sounds.   Pulmonary/Chest: Effort  normal and breath sounds normal.  Abdominal: Soft. There is tenderness.  Mild left CVA tenderness. LLQ tenderness.  Musculoskeletal: She exhibits no edema.  Neurological: She is alert and oriented to person, place, and time. No cranial nerve deficit.  Skin: Skin is warm and dry. No rash noted.  Psychiatric: She has a normal mood and affect. Her behavior is normal.  Nursing note and vitals reviewed.   ED Course  Procedures  DIAGNOSTIC STUDIES: Oxygen Saturation is 100% on room air, normal by  my interpretation.    COORDINATION OF CARE: 10:40 AM-Discussed treatment plan which includes medications, labs and CT renal stone study with pt at bedside and pt agreed to plan.    Labs Review Labs Reviewed  URINALYSIS, ROUTINE W REFLEX MICROSCOPIC - Abnormal; Notable for the following:    Specific Gravity, Urine >1.030 (*)    Glucose, UA 500 (*)    Hgb urine dipstick LARGE (*)    All other components within normal limits  COMPREHENSIVE METABOLIC PANEL - Abnormal; Notable for the following:    Glucose, Bld 300 (*)    All other components within normal limits  CBC WITH DIFFERENTIAL/PLATELET - Abnormal; Notable for the following:    RBC 5.56 (*)    Hemoglobin 11.5 (*)    MCV 66.9 (*)    MCH 20.7 (*)    RDW 16.5 (*)    All other components within normal limits  URINE MICROSCOPIC-ADD ON - Abnormal; Notable for the following:    Squamous Epithelial / LPF FEW (*)    All other components within normal limits  PREGNANCY, URINE  LIPASE, BLOOD    Imaging Review Ct Renal Stone Study  10/10/2014   CLINICAL DATA:  Left flank pain  EXAM: CT ABDOMEN AND PELVIS WITHOUT CONTRAST  TECHNIQUE: Multidetector CT imaging of the abdomen and pelvis was performed following the standard protocol without IV contrast.  COMPARISON:  None.  FINDINGS: Lung bases are unremarkable. Sagittal images of the spine shows mild degenerative changes lower thoracic spine. Unenhanced liver shows no biliary ductal dilatation. No calcified gallstones are noted within gallbladder. Unenhanced pancreas, spleen and adrenal glands are unremarkable. There is mild left hydronephrosis and minimal proximal left hydroureter. No nephrolithiasis.  In axial image 42 there is partial obstructive calcified calculus in proximal left ureter measures 3.5 mm at the level of mid aspect of L3 vertebral body.  No right ureteral calculi are noted. The uterus measures 8.5 x 6.9 cm. No adnexal masses noted. No calcified calculi are noted within urinary  bladder. Bilateral distal ureter is unremarkable.  No small bowel obstruction. No ascites or free air. No pericecal inflammation. Normal appendix.  IMPRESSION: 1. There is mild left hydronephrosis and minimal proximal left hydroureter. 2. There is 3.5 mm calcified calculus in proximal left ureter at the level of mid aspect of L3 vertebral body. 3. Bilateral distal ureter is unremarkable. No calcified calculi are noted within urinary bladder. 4. No pericecal inflammation.  Normal appendix. 5. No small bowel obstruction.   Electronically Signed   By: Lahoma Crocker M.D.   On: 10/10/2014 11:46     EKG Interpretation None      MDM   Final diagnoses:  Left ureteral stone    Patients presentation is consistent with renal colic. No red flags such as UTI, vomiting or fever. Stone less than 5 mm, should be able to pass. Pain significantly improved with one dose of IV morphine. Given toradol and fluids as well. D/C with oral pain  control, use ibuprofen, strainer, and discussed strict return precautions. Given urology f/u.  I personally performed the services described in this documentation, which was scribed in my presence. The recorded information has been reviewed and is accurate.    Rhonda Gambler, MD 10/10/14 [phone]

## 2014-10-10 NOTE — Discharge Instructions (Signed)
Clculos renales (Kidney Stones) Los clculos renales (urolitiasis) son masas slidas que se forman en el interior de los riones. El dolor intenso es causado por el movimiento de la piedra a travs del tracto urinario. Cuando la piedra se mueve, el urter hace un espasmo alrededor de la misma. El clculo generalmente se elimina con la [place].  CAUSAS   Un trastorno que hace que ciertas glndulas del cuello produzcan demasiada hormona paratiroidea (hiperparatiroidismo primario).  Una acumulacin de cristales de cido rico, similar a Psychiatric nurse.  Estrechamiento (constriccin) del urter.  Obstruccin en el rin presente al nacer (obstruccin congnita).  Cirugas previas del rin o los urteres.  Numerosas infecciones renales. SNTOMAS   Ganas de vomitar (nuseas).  Devolver la comida (vomitar).  Sangre en la orina (hematuria).  Dolor que generalmente se expande (irradia) hacia la ingle.  Ganas de orinar con frecuencia o de manera urgente. DIAGNSTICO   Historia clnica y examen fsico.  Anlisis de sangre y [place].  Tomografa computada.  En algunos casos se realiza un examen del interior de la vejiga (citoscopa). TRATAMIENTO   Observacin.  Aumentar la ingesta de lquidos.  Litotricia extracorprea con ondas de choque: es un procedimiento no invasivo que [place] ondas de choque para romper los clculos renales.  Ser necesaria la ciruga si tiene dolor muy intenso o la obstruccin persiste. Hay varios procedimientos quirrgicos. La mayora de los procedimientos se realizan con el uso de pequeos instrumentos. Slo es Chartered loss adjuster pequeas incisiones para acomodar estos instrumentos, por lo tanto el tiempo de recuperacin es mnimo. El tamao, la ubicacin y la composicin qumica de los clculos son variables importantes que determinarn la eleccin correcta de tratamiento para su caso. Comunquese con su mdico para comprender mejor su  situacin, de modo que pueda [employer] de lesiones para usted y su rin.  INSTRUCCIONES PARA EL CUIDADO EN EL HOGAR   Beba gran cantidad de lquido para mantener la orina de tono claro o color amarillo plido. Esto ayudar a eliminar las piedras o los fragmentos.  Cuele la orina con el colador que le han provisto. Guarde todas las partculas y piedras para que las vea el profesional que lo asiste. Puede ser tan pequea como un grano de sal. Es muy importante usar el colador cada vez que orine. La recoleccin de piedras permitir al [employer] y Musician que efectivamente ha eliminado una piedra. El anlisis de la piedra con frecuencia permitir identificar qu puede hacer para reducir la incidencia de las recurrencias.  Slo tome medicamentos de venta libre o recetados para Glass blower/designer, Health and safety inspector o bajar la fiebre, segn las indicaciones de su mdico.  Cumpla con las citas de seguimiento tal como le indic el profesional que lo asiste.  Si se lo indica, hgase radiografas. La ausencia de dolor no siempre significa que las piedras se han eliminado. Puede ser que simplemente hayan dejado de moverse. Si el paso de orina permanece completamente obstruido, puede causar prdida de la funcin renal o simplemente la destruccin del rin. Es su responsabilidad Artist seguimiento y las radiografas. Las ecografas del rin pueden mostrar una obstruccin y el estado del rin. Las ecografas no se asocian con la radiacin y pueden realizarse fcilmente en cuestin de minutos. SOLICITE ATENCIN MDICA SI:  Siente dolor que no responde a los Recruitment consultant. SOLICITE ATENCIN MDICA DE INMEDIATO SI:   No puede controlar el dolor con los medicamentos que le han recetado.  Siente escalofros  o fiebre.  La gravedad o la intensidad del dolor aumenta durante 18 horas y no se Engineer, production con los analgsicos.  Presenta un nuevo episodio de dolor abdominal.  Sufre mareos  o se desmaya.  No puede orinar. ASEGRESE DE QUE:   Comprende estas instrucciones.  Controlar su afeccin.  Recibir ayuda de inmediato si no mejora o si empeora. Document Released: 07/07/2005 Document Revised: 03/09/2013 [facility] Patient Information 2015 [place]. This information is not intended to replace advice given to you by your health care provider. Make sure you discuss any questions you have with your health care provider.

## 2014-10-10 NOTE — ED Notes (Signed)
Complain of sudden onset of left flank pain

## 2014-10-10 NOTE — ED Notes (Signed)
Pt states that she was told that she was diabetic at a Dr about a year ago but has not followed up any regarding this.

## 2017-06-15 ENCOUNTER — Emergency Department (HOSPITAL_COMMUNITY)
Admission: EM | Admit: 2017-06-15 | Discharge: 2017-06-15 | Disposition: A | Discharge status: Home / Self Care | Payer: No Typology Code available for payment source | Attending: Emergency Medicine | Admitting: Emergency Medicine

## 2017-06-15 ENCOUNTER — Other Ambulatory Visit: Payer: Self-pay

## 2017-06-15 ENCOUNTER — Emergency Department (HOSPITAL_COMMUNITY): Payer: No Typology Code available for payment source

## 2017-06-15 ENCOUNTER — Encounter (HOSPITAL_COMMUNITY): Payer: Self-pay | Admitting: Emergency Medicine

## 2017-06-15 DIAGNOSIS — Y999 Unspecified external cause status: Secondary | ICD-10-CM | POA: Diagnosis not present

## 2017-06-15 DIAGNOSIS — R0602 Shortness of breath: Secondary | ICD-10-CM | POA: Diagnosis not present

## 2017-06-15 DIAGNOSIS — R0789 Other chest pain: Secondary | ICD-10-CM

## 2017-06-15 DIAGNOSIS — S39012D Strain of muscle, fascia and tendon of lower back, subsequent encounter: Secondary | ICD-10-CM | POA: Diagnosis not present

## 2017-06-15 DIAGNOSIS — Y929 Unspecified place or not applicable: Secondary | ICD-10-CM | POA: Diagnosis not present

## 2017-06-15 DIAGNOSIS — Z791 Long term (current) use of non-steroidal anti-inflammatories (NSAID): Secondary | ICD-10-CM | POA: Diagnosis not present

## 2017-06-15 DIAGNOSIS — Y9389 Activity, other specified: Secondary | ICD-10-CM | POA: Insufficient documentation

## 2017-06-15 DIAGNOSIS — Z79899 Other long term (current) drug therapy: Secondary | ICD-10-CM | POA: Diagnosis not present

## 2017-06-15 DIAGNOSIS — S161XXD Strain of muscle, fascia and tendon at neck level, subsequent encounter: Secondary | ICD-10-CM | POA: Diagnosis not present

## 2017-06-15 DIAGNOSIS — R42 Dizziness and giddiness: Secondary | ICD-10-CM | POA: Insufficient documentation

## 2017-06-15 DIAGNOSIS — S199XXD Unspecified injury of neck, subsequent encounter: Secondary | ICD-10-CM | POA: Diagnosis present

## 2017-06-15 LAB — CBC
HCT: 35.7 % — ABNORMAL LOW (ref 36.0–46.0)
Hemoglobin: 10.2 g/dL — ABNORMAL LOW (ref 12.0–15.0)
MCH: 17.8 pg — ABNORMAL LOW (ref 26.0–34.0)
MCHC: 28.6 g/dL — AB (ref 30.0–36.0)
MCV: 62.3 fL — AB (ref 78.0–100.0)
PLATELETS: 273 10*3/uL (ref 150–400)
RBC: 5.73 MIL/uL — ABNORMAL HIGH (ref 3.87–5.11)
RDW: 17.4 % — AB (ref 11.5–15.5)
WBC: 8.1 10*3/uL (ref 4.0–10.5)

## 2017-06-15 LAB — I-STAT BETA HCG BLOOD, ED (MC, WL, AP ONLY): I-stat hCG, quantitative: <5 m[IU]/mL (ref <5)

## 2017-06-15 LAB — I-STAT CHEM 8, ED
BUN: 11 mg/dL (ref 6–20)
Calcium, Ion: 1.13 mmol/L — ABNORMAL LOW (ref 1.15–1.40)
Chloride: 106 mmol/L (ref 101–111)
Creatinine, Ser: 0.4 mg/dL — ABNORMAL LOW (ref 0.44–1.00)
GLUCOSE: 172 mg/dL — AB (ref 65–99)
HEMATOCRIT: 37 % (ref 36.0–46.0)
HEMOGLOBIN: 12.6 g/dL (ref 12.0–15.0)
Potassium: 3.2 mmol/L — ABNORMAL LOW (ref 3.5–5.1)
Sodium: 141 mmol/L (ref 135–145)
TCO2: 21 mmol/L — AB (ref 22–32)

## 2017-06-15 LAB — D-DIMER, QUANTITATIVE (NOT AT ARMC)

## 2017-06-15 MED ORDER — ONDANSETRON HCL 4 MG PO TABS: 4.0000 mg | ORAL_TABLET | Freq: Four times a day (QID) | ORAL | 0 refills | Status: DC

## 2017-06-15 MED ORDER — PROCHLORPERAZINE EDISYLATE 5 MG/ML IJ SOLN
10.0000 mg | Freq: Once | INTRAMUSCULAR | Status: AC
  Administered 2017-06-15: 10 mg via INTRAVENOUS
  Filled 2017-06-15: qty 2

## 2017-06-15 MED ORDER — KETOROLAC TROMETHAMINE 30 MG/ML IJ SOLN
30.0000 mg | Freq: Once | INTRAMUSCULAR | Status: AC
  Administered 2017-06-15: 30 mg via INTRAVENOUS
  Filled 2017-06-15: qty 1

## 2017-06-15 MED ORDER — NAPROXEN 500 MG PO TABS: 500.0000 mg | ORAL_TABLET | Freq: Two times a day (BID) | ORAL | 0 refills | Status: DC

## 2017-06-15 MED ORDER — DIPHENHYDRAMINE HCL 25 MG PO CAPS
25.0000 mg | ORAL_CAPSULE | Freq: Once | ORAL | Status: AC
  Administered 2017-06-15: 25 mg via ORAL
  Filled 2017-06-15: qty 1

## 2017-06-15 MED ORDER — CYCLOBENZAPRINE HCL 10 MG PO TABS: 10.0000 mg | ORAL_TABLET | Freq: Two times a day (BID) | ORAL | 0 refills | Status: DC | PRN

## 2017-06-15 NOTE — ED Notes (Signed)
Pt alert & oriented x4, stable gait. Patient given discharge instructions, paperwork & prescription(s). Patient  instructed to stop at the registration desk to finish any additional paperwork. Patient verbalized understanding. Pt left department w/ no further questions. 
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
## 2017-12-08 ENCOUNTER — Encounter (HOSPITAL_COMMUNITY): Payer: Self-pay

## 2017-12-08 ENCOUNTER — Ambulatory Visit (HOSPITAL_COMMUNITY): Payer: Self-pay | Attending: Family Medicine

## 2017-12-08 DIAGNOSIS — G8929 Other chronic pain: Secondary | ICD-10-CM | POA: Insufficient documentation

## 2017-12-08 DIAGNOSIS — R29898 Other symptoms and signs involving the musculoskeletal system: Secondary | ICD-10-CM | POA: Insufficient documentation

## 2017-12-08 DIAGNOSIS — M542 Cervicalgia: Secondary | ICD-10-CM | POA: Insufficient documentation

## 2017-12-08 DIAGNOSIS — M6281 Muscle weakness (generalized): Secondary | ICD-10-CM | POA: Insufficient documentation

## 2017-12-08 DIAGNOSIS — M545 Low back pain: Secondary | ICD-10-CM | POA: Insufficient documentation

## 2017-12-08 NOTE — Patient Instructions (Signed)
Piriformis Stretch    Acostado sobre la espalda, lleve la rodilla derecha hacia el hombro opuesto. Mantenga ____ segundos. Repita ____ veces. Haga ____ sesiones por da.  [email or web address]   Copyright  VHI. All rights reserved.
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
## 2017-12-16 ENCOUNTER — Ambulatory Visit (HOSPITAL_COMMUNITY): Payer: Self-pay

## 2017-12-16 ENCOUNTER — Encounter (HOSPITAL_COMMUNITY): Payer: Self-pay

## 2017-12-16 DIAGNOSIS — M545 Low back pain: Principal | ICD-10-CM

## 2017-12-16 DIAGNOSIS — G8929 Other chronic pain: Secondary | ICD-10-CM

## 2017-12-16 DIAGNOSIS — M542 Cervicalgia: Secondary | ICD-10-CM

## 2017-12-16 DIAGNOSIS — R29898 Other symptoms and signs involving the musculoskeletal system: Secondary | ICD-10-CM

## 2017-12-16 DIAGNOSIS — M6281 Muscle weakness (generalized): Secondary | ICD-10-CM

## 2017-12-16 NOTE — Patient Instructions (Signed)
Isometric Abdominal    Acostado boca arriba con las rodillas dobladas, contraiga los msculos del estomago presionando los codos contra el piso. Sostenga 5 segundos. Repita 10 veces por rutina. http://orth.exer.us/1087   Copyright  VHI. All rights reserved.
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
## 2017-12-22 ENCOUNTER — Ambulatory Visit (HOSPITAL_COMMUNITY): Payer: Self-pay | Attending: Family Medicine

## 2017-12-22 ENCOUNTER — Encounter (HOSPITAL_COMMUNITY): Payer: Self-pay

## 2017-12-22 DIAGNOSIS — R29898 Other symptoms and signs involving the musculoskeletal system: Secondary | ICD-10-CM | POA: Insufficient documentation

## 2017-12-22 DIAGNOSIS — G8929 Other chronic pain: Secondary | ICD-10-CM | POA: Insufficient documentation

## 2017-12-22 DIAGNOSIS — M542 Cervicalgia: Secondary | ICD-10-CM | POA: Insufficient documentation

## 2017-12-22 DIAGNOSIS — M545 Low back pain: Secondary | ICD-10-CM | POA: Insufficient documentation

## 2017-12-22 DIAGNOSIS — M6281 Muscle weakness (generalized): Secondary | ICD-10-CM | POA: Insufficient documentation

## 2017-12-22 NOTE — Therapy (Signed)
Rhonda [place], [place], [address] Phone: [phone]   Fax:  [phone]  Physical Therapy Treatment  Patient Details  Name: Rhonda Hawkins MRN: [id number] Date of Birth: 1976/06/30 Referring Provider: Gentry Fitz, MD   Encounter Date: 12/22/2017  PT End of Session - 12/22/17 1649    Visit Number  3    Number of Visits  13    Date for PT Re-Evaluation  01/19/18 Minireassess 12/29/17    Authorization Type  Self-pay    Authorization Time Period  12/08/17 to 01/19/18    PT Start Time  1608    PT Stop Time  1648    PT Time Calculation (min)  40 min    Activity Tolerance  Patient limited by pain;No increased pain;Patient tolerated treatment well    Behavior During Therapy  [facility] for tasks assessed/performed       History reviewed. No pertinent past medical history.  Past Surgical History:  Procedure Laterality Date  . DILATION AND CURETTAGE OF UTERUS    . LAPAROTOMY  02/07/2011   Procedure: LAPAROTOMY;  Surgeon: Jonnie Kind, MD;  Location: [facility];  Service: Gynecology;  Laterality: N/A;  . TUBAL LIGATION  02/07/2011   Procedure: BILATERAL TUBAL LIGATION;  Surgeon: Jonnie Kind, MD;  Location: [facility];  Service: Gynecology;  Laterality: Bilateral;  Bilateral salpingectomy    There were no vitals filed for this visit.                    [facility] PT Treatment/Exercise - 12/22/17 0001      Lumbar Exercises: Seated   Other Seated Lumbar Exercises  pelvic rotation 3x assessing SI alignment      Lumbar Exercises: Supine   Ab Set  10 reps;5 seconds multimodal cueing to improve activation and reduce compensat    Bridge  5 reps    Bridge Limitations  multiple sets during MET    Straight Leg Raise  5 reps Rt only following MET    Other Supine Lumbar Exercises  pubic clearning 2 sets 5x 5"      Manual Therapy   Manual Therapy  Other (comment)    Manual therapy comments  Manual  complete separate than rest of tx    Other Manual Therapy  Lt SI anterior rotation MET f/b core strengthening exercises               PT Short Term Goals - 12/08/17 1628      PT SHORT TERM GOAL #1   Title  Pt will be independent with HEP and perform consistently in order to decrease pain and improve overall function.     Time  3    Period  Weeks    Status  New    Target Date  12/29/17      PT SHORT TERM GOAL #2   Title  Pt will have 1/2 grade improvements in MMT in order to decrease pain and allow pt to perform ADLs and IADLs with greater ease.    Time  3    Period  Weeks    Status  New      PT SHORT TERM GOAL #3   Title  Pt will be able to tolerate sitting for 30 mins with 5/10 LBP or < in order to allow pt to relax at home with greater ease.    Time  3    Period  Weeks  Status  New        PT Long Term Goals - 12/08/17 1630      PT LONG TERM GOAL #1   Title  Pt will have 1 grade improvement in MMT throughout all musculature tested in order to further maximize ability to complete functional tasks at home.     Time  6    Period  Weeks    Status  New    Target Date  01/19/18      PT LONG TERM GOAL #2   Title  Pt will be able to perform bil SLS for 30 sec with min to no unsteadiness and with minimal evidence of trunk compensations or trendelenberg sign to demo improved core and functional hip strength.    Time  6    Period  Weeks    Status  New      PT LONG TERM GOAL #3   Title  Pt will report being able to perform household chores and then sit for 45 mins with 3/10 LBP or < in order to further maximize her ability to perform functional tasks at home.     Time  6    Period  Weeks    Status  New      PT LONG TERM GOAL #4   Title  Pt will report recreation of pain with 2/5 or fewer SI cluster tests to demo improved overall pain and maximize her function at home and to maximize her potential to RTW if cleared by MD.     Time  6    Period  Weeks    Status  New             Plan - 12/22/17 1712    Clinical Impression Statement  Session focus on SI alignment and core strengthening to assist with alignment.  Noted this session with Lt SI upslide and Rt anterior rotation.  Muscle energy technqiue to improve SI alingment with education on importance of core strengthening following.  Min cueing to improve isometric abdominal contractions.  Reports some relief at EOS.    Rehab Potential  Fair    PT Frequency  2x / week    PT Duration  6 weeks    PT Treatment/Interventions  ADLs/Self Care Home Management;Aquatic Therapy;Cryotherapy;Electrical Stimulation;Moist Heat;Traction;Ultrasound;DME Instruction;Gait training;Stair training;Functional mobility training;Therapeutic activities;Therapeutic exercise;Balance training;Cognitive remediation;Patient/family education;Manual techniques;Passive range of motion;Dry needling;Taping;Energy conservation    PT Next Visit Plan  focus on LBP/SI pain for now; further investigate SI involvement, assess for true LLD, core strengthening, functional strengthening, stretching, manual for soft tissue restrictions    PT Home Exercise Plan  eval: piriformis stretch; 5/29: Isometric abdominal contraction       Patient will benefit from skilled therapeutic intervention in order to improve the following deficits and impairments:  Abnormal gait, Decreased activity tolerance, Decreased balance, Decreased endurance, Decreased strength, Difficulty walking, Hypomobility, Increased fascial restricitons, Increased muscle spasms, Impaired flexibility, Improper body mechanics, Postural dysfunction, Pain  Visit Diagnosis: Chronic bilateral low back pain without sciatica  Other symptoms and signs involving the musculoskeletal system  Muscle weakness (generalized)  Cervicalgia     Problem List There are no active problems to display for this patient.  [address] LPTA; [place]  Aldona Lento 12/22/2017, [place] [address] [place], [place], [address] Phone: [phone]   Fax:  [phone]  Name: Rhonda Hawkins MRN: [id number] Date of Birth: Feb 28, 1976

## 2017-12-24 ENCOUNTER — Ambulatory Visit (HOSPITAL_COMMUNITY): Payer: Self-pay

## 2017-12-24 ENCOUNTER — Telehealth (HOSPITAL_COMMUNITY): Payer: Self-pay | Admitting: General Practice

## 2017-12-24 NOTE — Telephone Encounter (Signed)
12/24/17  daughter called to cx for her mom but no reason was given

## 2017-12-29 ENCOUNTER — Ambulatory Visit (HOSPITAL_COMMUNITY): Payer: Self-pay

## 2017-12-29 ENCOUNTER — Encounter (HOSPITAL_COMMUNITY): Payer: Self-pay

## 2017-12-29 DIAGNOSIS — G8929 Other chronic pain: Secondary | ICD-10-CM

## 2017-12-29 DIAGNOSIS — R29898 Other symptoms and signs involving the musculoskeletal system: Secondary | ICD-10-CM

## 2017-12-29 DIAGNOSIS — M542 Cervicalgia: Secondary | ICD-10-CM

## 2017-12-29 DIAGNOSIS — M545 Low back pain: Principal | ICD-10-CM

## 2017-12-29 DIAGNOSIS — M6281 Muscle weakness (generalized): Secondary | ICD-10-CM

## 2017-12-29 NOTE — Therapy (Signed)
[place], [place], [address] Phone: [phone]   Fax:  [phone]  Physical Therapy Treatment  Patient Details  Name: Rhonda Hawkins MRN: [id number] Date of Birth: 04-05-1976 Referring Provider: Gentry Fitz, MD   Encounter Date: 12/29/2017  PT End of Session - 12/29/17 1533    Visit Number  4    Number of Visits  13    Date for PT Re-Evaluation  01/19/18 Minireassess 12/29/17 (with PT next session)    Authorization Type  Self-pay    Authorization Time Period  12/08/17 to 01/19/18    PT Start Time  7741    PT Stop Time  1557    PT Time Calculation (min)  41 min    Activity Tolerance  Patient limited by pain;No increased pain;Patient tolerated treatment well    Behavior During Therapy  [facility] for tasks assessed/performed       History reviewed. No pertinent past medical history.  Past Surgical History:  Procedure Laterality Date  . DILATION AND CURETTAGE OF UTERUS    . LAPAROTOMY  02/07/2011   Procedure: LAPAROTOMY;  Surgeon: Jonnie Kind, MD;  Location: [facility];  Service: Gynecology;  Laterality: N/A;  . TUBAL LIGATION  02/07/2011   Procedure: BILATERAL TUBAL LIGATION;  Surgeon: Jonnie Kind, MD;  Location: [facility];  Service: Gynecology;  Laterality: Bilateral;  Bilateral salpingectomy    There were no vitals filed for this visit.  Subjective Assessment - 12/29/17 1520    Subjective  Pt stated she continues to have constant LBP pain scale 8/10 sharp pain.  Reports a little relief following last session but pain came right back.    Patient Stated Goals  back to get better, back to working without pain in back and neck    Currently in Pain?  Yes    Pain Score  8     Pain Location  Back    Pain Orientation  Lower    Pain Descriptors / Indicators  Sharp    Pain Type  Chronic pain    Pain Onset  More than a month ago    Pain Frequency  Constant    Aggravating Factors   sitting for long  periods of time, bending forward    Pain Relieving Factors  laying down on Lt side or leaning back    Effect of Pain on Daily Activities  increases                       [facility] Adult PT Treatment/Exercise - 12/29/17 0001      Lumbar Exercises: Standing   Other Standing Lumbar Exercises  walking with ab set x 3' following MET      Lumbar Exercises: Seated   Other Seated Lumbar Exercises  pelvic rotation 3x assessing SI alignment      Lumbar Exercises: Supine   Ab Set  10 reps;5 seconds    Pelvic Tilt  10 reps;5 seconds    Bridge  10 reps    Bridge Limitations  multiple sets during MET, 10x Lt foot closer following MET    Straight Leg Raise  10 reps Rt only following MET    Other Supine Lumbar Exercises  pubic clearning 2 sets 5x 5"      Manual Therapy   Manual Therapy  Other (comment)    Manual therapy comments  Manual complete separate than rest of tx    Other Manual Therapy  Lt SI anterior rotation MET f/b core strengthening exercises               PT Short Term Goals - 12/08/17 1628      PT SHORT TERM GOAL #1   Title  Pt will be independent with HEP and perform consistently in order to decrease pain and improve overall function.     Time  3    Period  Weeks    Status  New    Target Date  12/29/17      PT SHORT TERM GOAL #2   Title  Pt will have 1/2 grade improvements in MMT in order to decrease pain and allow pt to perform ADLs and IADLs with greater ease.    Time  3    Period  Weeks    Status  New      PT SHORT TERM GOAL #3   Title  Pt will be able to tolerate sitting for 30 mins with 5/10 LBP or < in order to allow pt to relax at home with greater ease.    Time  3    Period  Weeks    Status  New        PT Long Term Goals - 12/08/17 1630      PT LONG TERM GOAL #1   Title  Pt will have 1 grade improvement in MMT throughout all musculature tested in order to further maximize ability to complete functional tasks at home.     Time  6     Period  Weeks    Status  New    Target Date  01/19/18      PT LONG TERM GOAL #2   Title  Pt will be able to perform bil SLS for 30 sec with min to no unsteadiness and with minimal evidence of trunk compensations or trendelenberg sign to demo improved core and functional hip strength.    Time  6    Period  Weeks    Status  New      PT LONG TERM GOAL #3   Title  Pt will report being able to perform household chores and then sit for 45 mins with 3/10 LBP or < in order to further maximize her ability to perform functional tasks at home.     Time  6    Period  Weeks    Status  New      PT LONG TERM GOAL #4   Title  Pt will report recreation of pain with 2/5 or fewer SI cluster tests to demo improved overall pain and maximize her function at home and to maximize her potential to RTW if cleared by MD.     Time  6    Period  Weeks    Status  New            Plan - 12/29/17 1611    Clinical Impression Statement  MET complete for Lt SI anterior rotation followed by core and proximal strengthening exercises.  Pt educated on importance of core strengthening to assure SI stays in alignment.  Included walking with ab set to assist with SI alignment.  Noted increased clearance with bridges, equalized leg length and reports of relief with posterior pelvic tilt.  Added pelvic tilts to HEP.      Rehab Potential  Fair    PT Frequency  2x / week    PT Duration  6 weeks    PT Treatment/Interventions  ADLs/Self Care  Home Management;Aquatic Therapy;Cryotherapy;Electrical Stimulation;Moist Heat;Traction;Ultrasound;DME Instruction;Gait training;Stair training;Functional mobility training;Therapeutic activities;Therapeutic exercise;Balance training;Cognitive remediation;Patient/family education;Manual techniques;Passive range of motion;Dry needling;Taping;Energy conservation    PT Next Visit Plan  Mini-reassess next session.  focus on LBP/SI pain for now; further investigate SI involvement, assess for true  LLD, core strengthening, functional strengthening, stretching, manual for soft tissue restrictions    PT Home Exercise Plan  eval: piriformis stretch; 5/29: Isometric abdominal contraction       Patient will benefit from skilled therapeutic intervention in order to improve the following deficits and impairments:  Abnormal gait, Decreased activity tolerance, Decreased balance, Decreased endurance, Decreased strength, Difficulty walking, Hypomobility, Increased fascial restricitons, Increased muscle spasms, Impaired flexibility, Improper body mechanics, Postural dysfunction, Pain  Visit Diagnosis: Chronic bilateral low back pain without sciatica  Other symptoms and signs involving the musculoskeletal system  Muscle weakness (generalized)  Cervicalgia     Problem List There are no active problems to display for this patient.  [address], LPTA; [facility] [phone]  Aldona Lento 12/29/2017, 4:17 PM  [facility] [address] [place], [place], [address] Phone: [phone]   Fax:  [phone]  Name: Rhonda Hawkins MRN: [id number] Date of Birth: Jun 01, 1976

## 2017-12-29 NOTE — Patient Instructions (Signed)
Pelvic Tilt (Flexion)    Con los pies apoyados y las rodillas dobladas, aplane la parte inferior de la espalda contra la cama. Contraiga los msculos del abdomen. Mantenga y cuente hasta 5 en voz alta. Repita 10 veces. Haga 1-2 sesiones por Training and development officer.  http://[email or web address].exer.us/230   Copyright  VHI. All rights reserved.

## 2017-12-31 ENCOUNTER — Ambulatory Visit (HOSPITAL_COMMUNITY): Payer: Self-pay

## 2017-12-31 DIAGNOSIS — M545 Low back pain: Principal | ICD-10-CM

## 2017-12-31 DIAGNOSIS — M6281 Muscle weakness (generalized): Secondary | ICD-10-CM

## 2017-12-31 DIAGNOSIS — M542 Cervicalgia: Secondary | ICD-10-CM

## 2017-12-31 DIAGNOSIS — G8929 Other chronic pain: Secondary | ICD-10-CM

## 2017-12-31 DIAGNOSIS — R29898 Other symptoms and signs involving the musculoskeletal system: Secondary | ICD-10-CM
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
## 2018-01-05 ENCOUNTER — Ambulatory Visit (HOSPITAL_COMMUNITY): Payer: Self-pay

## 2018-01-05 ENCOUNTER — Encounter (HOSPITAL_COMMUNITY): Payer: Self-pay

## 2018-01-05 DIAGNOSIS — G8929 Other chronic pain: Secondary | ICD-10-CM

## 2018-01-05 DIAGNOSIS — R29898 Other symptoms and signs involving the musculoskeletal system: Secondary | ICD-10-CM

## 2018-01-05 DIAGNOSIS — M542 Cervicalgia: Secondary | ICD-10-CM

## 2018-01-05 DIAGNOSIS — M545 Low back pain: Principal | ICD-10-CM

## 2018-01-05 DIAGNOSIS — M6281 Muscle weakness (generalized): Secondary | ICD-10-CM

## 2018-01-05 NOTE — Therapy (Signed)
[place], [place], [address] Phone: [phone]   Fax:  [phone]  Physical Therapy Treatment  Patient Details  Name: Rhonda Hawkins MRN: [id number] Date of Birth: [birth year] Referring Provider: Gentry Fitz, MD   Encounter Date: 01/05/2018  PT End of Session - 01/05/18 1648    Visit Number  6    Number of Visits  13    Date for PT Re-Evaluation  01/19/18 Mini-reassess complete 12/31/17    Authorization Type  Self-pay    Authorization Time Period  12/08/17 to 01/19/18    PT Start Time  1608    PT Stop Time  1648    PT Time Calculation (min)  40 min    Activity Tolerance  Patient limited by pain;No increased pain;Patient tolerated treatment well    Behavior During Therapy  [facility] for tasks assessed/performed       History reviewed. No pertinent past medical history.  Past Surgical History:  Procedure Laterality Date  . DILATION AND CURETTAGE OF UTERUS    . LAPAROTOMY  02/07/2011   Procedure: LAPAROTOMY;  Surgeon: Jonnie Kind, MD;  Location: [facility];  Service: Gynecology;  Laterality: N/A;  . TUBAL LIGATION  02/07/2011   Procedure: BILATERAL TUBAL LIGATION;  Surgeon: Jonnie Kind, MD;  Location: [facility];  Service: Gynecology;  Laterality: Bilateral;  Bilateral salpingectomy    There were no vitals filed for this visit.                    [facility] Adult PT Treatment/Exercise - 01/05/18 0001      Lumbar Exercises: Seated   Other Seated Lumbar Exercises  pelvic rotation 3x assessing SI alignment      Lumbar Exercises: Supine   Pelvic Tilt  10 reps;5 seconds    Bridge  10 reps    Bridge Limitations  multiple sets during MET, 10x Lt foot closer following MET    Straight Leg Raise  10 reps Rt only following MET    Other Supine Lumbar Exercises  pubic clearning 2 sets 5x 5"      Lumbar Exercises: Sidelying   Clam  Right;10 reps    Hip Abduction  Right;5 reps 2 sets      Manual Therapy   Manual Therapy  Other (comment);Soft tissue mobilization    Manual therapy comments  Manual complete separate than rest of tx    Soft tissue mobilization  STM to glut med Rt >Lt pt sidelying wiht pillow between knees    Other Manual Therapy  Lt SI anterior rotation MET f/b core strengthening exercises               PT Short Term Goals - 12/31/17 1524      PT SHORT TERM GOAL #1   Title  Pt will be independent with HEP and perform consistently in order to decrease pain and improve overall function.     Baseline  6/13: reports compliance    Time  3    Period  Weeks    Status  Achieved      PT SHORT TERM GOAL #2   Title  Pt will have 1/2 grade improvements in MMT in order to decrease pain and allow pt to perform ADLs and IADLs with greater ease.    Time  3    Period  Weeks    Status  On-going      PT SHORT TERM GOAL #3  Title  Pt will be able to tolerate sitting for 30 mins with 5/10 LBP or < in order to allow pt to relax at home with greater ease.    Baseline  6/13: still can't be in same position for long time; can only sit for 5 mins comfortably, 10-15 mins with 5/10 LBP or less    Time  3    Period  Weeks    Status  On-going        PT Long Term Goals - 12/31/17 1527      PT LONG TERM GOAL #1   Title  Pt will have 1 grade improvement in MMT throughout all musculature tested in order to further maximize ability to complete functional tasks at home.     Baseline  6/13: see MMT    Time  6    Period  Weeks    Status  On-going      PT LONG TERM GOAL #2   Title  Pt will be able to perform bil SLS for 30 sec with min to no unsteadiness and with minimal evidence of trunk compensations or trendelenberg sign to demo improved core and functional hip strength.    Time  6    Period  Weeks    Status  On-going      PT LONG TERM GOAL #3   Title  Pt will report being able to perform household chores and then sit for 45 mins with 3/10 LBP or < in order to  further maximize her ability to perform functional tasks at home.     Baseline  6/13: has been trying to do it but can't do it long    Time  6    Period  Weeks    Status  On-going      PT LONG TERM GOAL #4   Title  Pt will report recreation of pain with 2/5 or fewer SI cluster tests to demo improved overall pain and maximize her function at home and to maximize her potential to RTW if cleared by MD.     Time  6    Period  Weeks    Status  On-going            Plan - 01/05/18 1748    Clinical Impression Statement  Added manual soft tissue mobilization techniques to address tight gluteal musculature, noted some hamstring tightness that needs to be addressed next session.  Assess SI dysfunctional wiht noted Lt SI anterior rotated and increased LLD, manual Muscle Energy Techniques complete to address dysfunction with reports of pain reduce to 7/10 following MEt.  Continues wiht core and proximal strengthening to assist with SI alignment.      Rehab Potential  Fair    PT Frequency  2x / week    PT Duration  6 weeks    PT Treatment/Interventions  ADLs/Self Care Home Management;Aquatic Therapy;Cryotherapy;Electrical Stimulation;Moist Heat;Traction;Ultrasound;DME Instruction;Gait training;Stair training;Functional mobility training;Therapeutic activities;Therapeutic exercise;Balance training;Cognitive remediation;Patient/family education;Manual techniques;Passive range of motion;Dry needling;Taping;Energy conservation    PT Next Visit Plan  Add supine hamstring stretch to address tightness.  Continue MFR/STM to bil glute meds (L>R) to decrease restrictions and decrease pain; STM lumbar paraspinals for pain relief; continue to address SIJ as needed; isolated glute med strengthening, continue core strength.    PT Home Exercise Plan  eval: piriformis stretch; 5/29: Isometric abdominal contraction       Patient will benefit from skilled therapeutic intervention in order to improve the following  deficits and impairments:  Abnormal gait, Decreased activity tolerance, Decreased balance, Decreased endurance, Decreased strength, Difficulty walking, Hypomobility, Increased fascial restricitons, Increased muscle spasms, Impaired flexibility, Improper body mechanics, Postural dysfunction, Pain  Visit Diagnosis: Chronic bilateral low back pain without sciatica  Other symptoms and signs involving the musculoskeletal system  Muscle weakness (generalized)  Cervicalgia     Problem List There are no active problems to display for this patient.  [address] LPTA; [place]  Aldona Lento 01/05/2018, 5:53 PM  [place] [address] [place], [place], [address] Phone: [phone]   Fax:  [phone]  Name: JOMAIRA DARR MRN: [id number] Date of Birth: 17-Nov-1975

## 2018-01-07 ENCOUNTER — Encounter (HOSPITAL_COMMUNITY): Payer: Self-pay

## 2018-01-07 ENCOUNTER — Ambulatory Visit (HOSPITAL_COMMUNITY): Payer: Self-pay

## 2018-01-07 DIAGNOSIS — M545 Low back pain: Principal | ICD-10-CM

## 2018-01-07 DIAGNOSIS — R29898 Other symptoms and signs involving the musculoskeletal system: Secondary | ICD-10-CM

## 2018-01-07 DIAGNOSIS — M542 Cervicalgia: Secondary | ICD-10-CM

## 2018-01-07 DIAGNOSIS — M6281 Muscle weakness (generalized): Secondary | ICD-10-CM

## 2018-01-07 DIAGNOSIS — G8929 Other chronic pain: Secondary | ICD-10-CM

## 2018-01-07 NOTE — Patient Instructions (Signed)
Hamstring Stretch    Con la rodilla opuesta flexionada y el pie apoyado, tmese la pierna derecha e intente estirar lentamente la rodilla. Mantenga 30 segundos. Repita 3 veces. Haga 1-2 sesiones por Training and development officer.  http://[email or web address].exer.us/280   Copyright  VHI. All rights reserved.   http://[email or web address].exer.us/226 Knee to Chest (Flexion)    Agarre la rodilla y [place]. Sienta un estiramiento en la parte inferior de la columna o en las glteos. Respirando profundamente, mantenga la posicin y cuente [place] [address]. Repita con la otra rodilla. Repita la serie 3 veces. Haga 1-2 sesiones por Training and development officer.  http://[email or web address].exer.us/226   Copyright  VHI. All rights reserved.

## 2018-01-07 NOTE — Therapy (Signed)
[place], [place], [address] Phone: [phone]   Fax:  [phone]  Physical Therapy Treatment  Patient Details  Name: Rhonda Hawkins MRN: [id number] Date of Birth: 13-Jun-1976 Referring Provider: Gentry Fitz, MD   Encounter Date: 01/07/2018  PT End of Session - 01/07/18 1610    Visit Number  7    Number of Visits  13    Date for PT Re-Evaluation  01/19/18 Mini-reassess complete 12/31/17    Authorization Type  Self-pay    Authorization Time Period  12/08/17 to 01/19/18    PT Start Time  1606    PT Stop Time  1655    PT Time Calculation (min)  49 min    Activity Tolerance  Patient limited by pain;No increased pain;Patient tolerated treatment well    Behavior During Therapy  [facility] for tasks assessed/performed       History reviewed. No pertinent past medical history.  Past Surgical History:  Procedure Laterality Date  . DILATION AND CURETTAGE OF UTERUS    . LAPAROTOMY  02/07/2011   Procedure: LAPAROTOMY;  Surgeon: Jonnie Kind, MD;  Location: [facility];  Service: Gynecology;  Laterality: N/A;  . TUBAL LIGATION  02/07/2011   Procedure: BILATERAL TUBAL LIGATION;  Surgeon: Jonnie Kind, MD;  Location: [facility];  Service: Gynecology;  Laterality: Bilateral;  Bilateral salpingectomy    There were no vitals filed for this visit.  Subjective Assessment - 01/07/18 1610    Subjective  Pt stated she continues to have high pain scale 8/10 lower back more Lt > Rt.  (Pended)     Patient Stated Goals  back to get better, back to working without pain in back and neck  (Pended)     Currently in Pain?  Yes  (Pended)     Pain Score  8   (Pended)          [facility] PT Assessment - 01/07/18 0001      Flexibility   Soft Tissue Assessment /Muscle Length  yes    Hamstrings  SLR limited by pain, able to acheive ~40-45 degrees Bil LE                   [facility] Adult PT Treatment/Exercise - 01/07/18 0001       Lumbar Exercises: Stretches   Active Hamstring Stretch  3 reps;30 seconds;Limitations    Active Hamstring Stretch Limitations  supine with hands behind knees    Single Knee to Chest Stretch  2 reps;30 seconds      Lumbar Exercises: Supine   Pelvic Tilt  10 reps;5 seconds  (Pended)     Clam  5 reps;Limitations  (Pended)     Bent Knee Raise  10 reps  (Pended)  2 sets; added 6in step height 2nd round with reports of decr    Bridge  10 reps  (Pended)     Bridge Limitations  multiple sets during MET, 10x Lt foot closer following MET  (Pended)       Manual Therapy   Manual Therapy  Soft tissue mobilization;Other (comment)    Manual therapy comments  Manual complete separate than rest of tx    Soft tissue mobilization  STM to glut med Rt and Lt pt sidelying wiht pillow between knees               PT Short Term Goals - 12/31/17 1524      PT SHORT TERM  GOAL #1   Title  Pt will be independent with HEP and perform consistently in order to decrease pain and improve overall function.     Baseline  6/13: reports compliance    Time  3    Period  Weeks    Status  Achieved      PT SHORT TERM GOAL #2   Title  Pt will have 1/2 grade improvements in MMT in order to decrease pain and allow pt to perform ADLs and IADLs with greater ease.    Time  3    Period  Weeks    Status  On-going      PT SHORT TERM GOAL #3   Title  Pt will be able to tolerate sitting for 30 mins with 5/10 LBP or < in order to allow pt to relax at home with greater ease.    Baseline  6/13: still can't be in same position for long time; can only sit for 5 mins comfortably, 10-15 mins with 5/10 LBP or less    Time  3    Period  Weeks    Status  On-going        PT Long Term Goals - 12/31/17 1527      PT LONG TERM GOAL #1   Title  Pt will have 1 grade improvement in MMT throughout all musculature tested in order to further maximize ability to complete functional tasks at home.     Baseline  6/13: see MMT     Time  6    Period  Weeks    Status  On-going      PT LONG TERM GOAL #2   Title  Pt will be able to perform bil SLS for 30 sec with min to no unsteadiness and with minimal evidence of trunk compensations or trendelenberg sign to demo improved core and functional hip strength.    Time  6    Period  Weeks    Status  On-going      PT LONG TERM GOAL #3   Title  Pt will report being able to perform household chores and then sit for 45 mins with 3/10 LBP or < in order to further maximize her ability to perform functional tasks at home.     Baseline  6/13: has been trying to do it but can't do it long    Time  6    Period  Weeks    Status  On-going      PT LONG TERM GOAL #4   Title  Pt will report recreation of pain with 2/5 or fewer SI cluster tests to demo improved overall pain and maximize her function at home and to maximize her potential to RTW if cleared by MD.     Time  6    Period  Weeks    Status  On-going            Plan - 01/07/18 1703    Clinical Impression Statement  Began session with manual soft tissue mobilization to address tight gluteal muscualture.  Noted trigger point Lt glut med, able to resolve trigger point.  Pt continues to be limited by pain.  Pt continues to have SI dysfunction wiht increased Lt LE LLD.  Due to reports of no improvements wiht MET in the past, held muscle enegery techniques this session.  Did complete pubic clearing that did assist with some anterior rotation.  Increased focus on therex for core stability.  Pt with reports  of pain reduced with hip flexion, included 6 in step height for bent knee raise and clams.  Added hamstring stretches and SKTC to address tightness with reports of relief following.  No reports of increased pain at EOS.    Rehab Potential  Fair    PT Frequency  2x / week    PT Duration  6 weeks    PT Treatment/Interventions  ADLs/Self Care Home Management;Aquatic Therapy;Cryotherapy;Electrical Stimulation;Moist  Heat;Traction;Ultrasound;DME Instruction;Gait training;Stair training;Functional mobility training;Therapeutic activities;Therapeutic exercise;Balance training;Cognitive remediation;Patient/family education;Manual techniques;Passive range of motion;Dry needling;Taping;Energy conservation    PT Next Visit Plan  Continue MFR/STM to bil glute meds (L>R) to decrease restrictions and decrease pain; STM lumbar paraspinals for pain relief; continue to address SIJ as needed; isolated glute med strengthening, continue core strength.    PT Home Exercise Plan  eval: piriformis stretch; 5/29: Isometric abdominal contraction; 6/19: hamstring stretch and SKTC       Patient will benefit from skilled therapeutic intervention in order to improve the following deficits and impairments:  Abnormal gait, Decreased activity tolerance, Decreased balance, Decreased endurance, Decreased strength, Difficulty walking, Hypomobility, Increased fascial restricitons, Increased muscle spasms, Impaired flexibility, Improper body mechanics, Postural dysfunction, Pain  Visit Diagnosis: Chronic bilateral low back pain without sciatica  Other symptoms and signs involving the musculoskeletal system  Muscle weakness (generalized)  Cervicalgia     Problem List There are no active problems to display for this patient.  [address], LPTA; [place]  Aldona Lento 01/07/2018, 5:11 PM  [place], [place], [address] Phone: [phone]   Fax:  [phone]  Name: Rhonda Hawkins MRN: [id number] Date of Birth: 04/29/1976

## 2018-01-12 ENCOUNTER — Ambulatory Visit (HOSPITAL_COMMUNITY): Payer: Self-pay

## 2018-01-12 DIAGNOSIS — M542 Cervicalgia: Secondary | ICD-10-CM

## 2018-01-12 DIAGNOSIS — M6281 Muscle weakness (generalized): Secondary | ICD-10-CM

## 2018-01-12 DIAGNOSIS — M545 Low back pain, unspecified: Secondary | ICD-10-CM

## 2018-01-12 DIAGNOSIS — G8929 Other chronic pain: Secondary | ICD-10-CM

## 2018-01-12 DIAGNOSIS — R29898 Other symptoms and signs involving the musculoskeletal system: Secondary | ICD-10-CM

## 2018-01-12 NOTE — Therapy (Addendum)
[place], [place], [address] Phone: [phone]   Fax:  [phone]  Physical Therapy Treatment  Patient Details  Name: Rhonda Hawkins MRN: [id number] Date of Birth: [birth year] Referring Provider: Gentry Fitz, MD   Encounter Date: 01/12/2018  PT End of Session - 01/12/18 1522    Visit Number  8    Number of Visits  13    Date for PT Re-Evaluation  01/19/18 Mini-reassess complete 12/31/17    Authorization Type  Self-pay    Authorization Time Period  12/08/17 to 01/19/18    PT Start Time  1521    PT Stop Time  1612    PT Time Calculation (min)  51 min    Activity Tolerance  Patient limited by pain;No increased pain;Patient tolerated treatment well    Behavior During Therapy  [facility] for tasks assessed/performed       No past medical history on file.  Past Surgical History:  Procedure Laterality Date  . DILATION AND CURETTAGE OF UTERUS    . LAPAROTOMY  02/07/2011   Procedure: LAPAROTOMY;  Surgeon: Jonnie Kind, MD;  Location: [facility];  Service: Gynecology;  Laterality: N/A;  . TUBAL LIGATION  02/07/2011   Procedure: BILATERAL TUBAL LIGATION;  Surgeon: Jonnie Kind, MD;  Location: [facility];  Service: Gynecology;  Laterality: Bilateral;  Bilateral salpingectomy    There were no vitals filed for this visit.  Subjective Assessment - 01/12/18 1522    Subjective  Pt reports she feels so-so, rates it at 7/10.     Patient Stated Goals  back to get better, back to working without pain in back and neck    Currently in Pain?  Yes    Pain Score  7     Pain Location  Back    Pain Orientation  Lower    Pain Descriptors / Indicators  Sharp    Pain Type  Chronic pain    Pain Onset  More than a month ago    Pain Frequency  Constant    Aggravating Factors   sitting for long periods of time, bending forward    Pain Relieving Factors  laying down on L side or leaning back    Effect of Pain on Daily  Activities  increases          [facility] Adult PT Treatment/Exercise - 01/12/18 0001      Lumbar Exercises: Stretches   Prone on Elbows Stretch Limitations  x5 mins, no change in pain      Lumbar Exercises: Standing   Other Standing Lumbar Exercises  REIS 2x30 reps: no change in pain after 1st set, increased pain after 2nd set    Other Standing Lumbar Exercises  RFIS: unable to perform due to pain      Lumbar Exercises: Sidelying   Other Sidelying Lumbar Exercises  L iso clams with belt 10x10" (increased pain)      Manual Therapy   Manual Therapy  Soft tissue mobilization    Manual therapy comments  Manual complete separate than rest of tx    Soft tissue mobilization  efflurage to bil lumbar paraspinals after needling       Trigger Point Dry Needling - 01/12/18 1633    Consent Given?  Yes    Education Handout Provided  Yes in Spanish    Muscles Treated Upper Body  Longissimus    Longissimus Response  Twitch response elicited;Palpable increased muscle length R/L lumbar  multifidis, L2-3           PT Education - 01/12/18 1616    Education provided  Yes    Education Details  trigger point dry needling risks and benefits; will likely experience increased soreness from needling    Person(s) Educated  Patient;Child(ren)    Methods  Explanation;Demonstration;Handout    Comprehension  Verbalized understanding;Returned demonstration       PT Short Term Goals - 12/31/17 1524      PT SHORT TERM GOAL #1   Title  Pt will be independent with HEP and perform consistently in order to decrease pain and improve overall function.     Baseline  6/13: reports compliance    Time  3    Period  Weeks    Status  Achieved      PT SHORT TERM GOAL #2   Title  Pt will have 1/2 grade improvements in MMT in order to decrease pain and allow pt to perform ADLs and IADLs with greater ease.    Time  3    Period  Weeks    Status  On-going      PT SHORT TERM GOAL #3   Title  Pt will be able to  tolerate sitting for 30 mins with 5/10 LBP or < in order to allow pt to relax at home with greater ease.    Baseline  6/13: still can't be in same position for long time; can only sit for 5 mins comfortably, 10-15 mins with 5/10 LBP or less    Time  3    Period  Weeks    Status  On-going        PT Long Term Goals - 12/31/17 1527      PT LONG TERM GOAL #1   Title  Pt will have 1 grade improvement in MMT throughout all musculature tested in order to further maximize ability to complete functional tasks at home.     Baseline  6/13: see MMT    Time  6    Period  Weeks    Status  On-going      PT LONG TERM GOAL #2   Title  Pt will be able to perform bil SLS for 30 sec with min to no unsteadiness and with minimal evidence of trunk compensations or trendelenberg sign to demo improved core and functional hip strength.    Time  6    Period  Weeks    Status  On-going      PT LONG TERM GOAL #3   Title  Pt will report being able to perform household chores and then sit for 45 mins with 3/10 LBP or < in order to further maximize her ability to perform functional tasks at home.     Baseline  6/13: has been trying to do it but can't do it long    Time  6    Period  Weeks    Status  On-going      PT LONG TERM GOAL #4   Title  Pt will report recreation of pain with 2/5 or fewer SI cluster tests to demo improved overall pain and maximize her function at home and to maximize her potential to RTW if cleared by MD.     Time  6    Period  Weeks    Status  On-going            Plan - 01/12/18 1631    Clinical Impression Statement  Pt continues to present to therapy with increased pain with minimal improvements thus far. REIS x30 reps did not change pain but after another 30 reps to try and establish a direction preference, pt reported increased pain. Attempted to perform RFIS but pt with immediate pain and unable to complete multiple reps. PT had pt pinpoint her pain and she pointed to her  lumbar extensors and bil gluteus maximus. This PT feels pt's c/c are muscular in nature so educated her and her dtr on trigger point dry needling and she verbalized understanding after reading the risk and benefits of it. Dry needled her bil lumbar multifidus at L2-3 this date. Good twitch response elicited bilaterally but greater twitch response noted to occur on the R. During needling, PT noted that her L lumbar transverse process was more superficial than the R when comparing the depth the needle went to reach the transverse process; pt potentially has a lumbar rotation that could also be playing a role in her pain, however, her spinous processes were grossly in-line/symmetrical so will have to further assess in future sessions. PT educated pt that she would likely feel increased soreness and fatigue in the areas in which were needled but that this was normal; she and her dtr verbalized understanding.     Rehab Potential  Fair    PT Frequency  2x / week    PT Duration  6 weeks    PT Treatment/Interventions  ADLs/Self Care Home Management;Aquatic Therapy;Cryotherapy;Electrical Stimulation;Moist Heat;Traction;Ultrasound;DME Instruction;Gait training;Stair training;Functional mobility training;Therapeutic activities;Therapeutic exercise;Balance training;Cognitive remediation;Patient/family education;Manual techniques;Passive range of motion;Dry needling;Taping;Energy conservation    PT Next Visit Plan  assess how she felt after needling; Continue MFR/STM to bil glute meds (L>R) to decrease restrictions and decrease pain; STM lumbar paraspinals for pain relief; continue to address SIJ as needed; isolated glute med strengthening, continue core strength.    PT Home Exercise Plan  eval: piriformis stretch; 5/29: Isometric abdominal contraction; 6/19: hamstring stretch and SKTC    Consulted and Agree with Plan of Care  Patient;Family member/caregiver    Family Member Consulted  dtr       Patient will benefit  from skilled therapeutic intervention in order to improve the following deficits and impairments:  Abnormal gait, Decreased activity tolerance, Decreased balance, Decreased endurance, Decreased strength, Difficulty walking, Hypomobility, Increased fascial restricitons, Increased muscle spasms, Impaired flexibility, Improper body mechanics, Postural dysfunction, Pain  Visit Diagnosis: Chronic bilateral low back pain without sciatica  Other symptoms and signs involving the musculoskeletal system  Muscle weakness (generalized)  Cervicalgia     Problem List There are no active problems to display for this patient.      Geraldine Solar PT, [place] [address] [place], [place], [address] Phone: [phone]   Fax:  [phone]  Name: Rhonda Hawkins MRN: [id number] Date of Birth: 10/04/1975

## 2018-01-14 ENCOUNTER — Encounter (HOSPITAL_COMMUNITY): Payer: Self-pay

## 2018-01-14 ENCOUNTER — Ambulatory Visit (HOSPITAL_COMMUNITY): Payer: Self-pay

## 2018-01-14 DIAGNOSIS — R29898 Other symptoms and signs involving the musculoskeletal system: Secondary | ICD-10-CM

## 2018-01-14 DIAGNOSIS — M545 Low back pain: Principal | ICD-10-CM

## 2018-01-14 DIAGNOSIS — M542 Cervicalgia: Secondary | ICD-10-CM

## 2018-01-14 DIAGNOSIS — G8929 Other chronic pain: Secondary | ICD-10-CM

## 2018-01-14 DIAGNOSIS — M6281 Muscle weakness (generalized): Secondary | ICD-10-CM

## 2018-01-14 NOTE — Therapy (Signed)
[place], [place], [address] Phone: [phone]   Fax:  [phone]  Physical Therapy Treatment  Patient Details  Name: Rhonda Hawkins MRN: [id number] Date of Birth: 09/22/1975 Referring Provider: Gentry Fitz, MD   Encounter Date: 01/14/2018  PT End of Session - 01/14/18 1550    Visit Number  9    Number of Visits  13    Date for PT Re-Evaluation  01/19/18 Minireassess complete 12/31/17    Authorization Type  Self-pay    Authorization Time Period  12/08/17 to 01/19/18    PT Start Time  1518    PT Stop Time  1557    PT Time Calculation (min)  39 min    Activity Tolerance  Patient limited by pain;No increased pain;Patient tolerated treatment well Reports pain reduced at EOS    Behavior During Therapy  [facility] for tasks assessed/performed       History reviewed. No pertinent past medical history.  Past Surgical History:  Procedure Laterality Date  . DILATION AND CURETTAGE OF UTERUS    . LAPAROTOMY  02/07/2011   Procedure: LAPAROTOMY;  Surgeon: Jonnie Kind, MD;  Location: [facility];  Service: Gynecology;  Laterality: N/A;  . TUBAL LIGATION  02/07/2011   Procedure: BILATERAL TUBAL LIGATION;  Surgeon: Jonnie Kind, MD;  Location: [facility];  Service: Gynecology;  Laterality: Bilateral;  Bilateral salpingectomy    There were no vitals filed for this visit.  Subjective Assessment - 01/14/18 1519    Subjective  Pt reports some relief following the needling last session.  Current pain scale 7/10 LBP.    Patient Stated Goals  back to get better, back to working without pain in back and neck    Currently in Pain?  Yes    Pain Score  7     Pain Location  Back    Pain Orientation  Lower    Pain Type  Chronic pain    Pain Onset  More than a month ago    Pain Frequency  Constant    Aggravating Factors   sitting for long periods of time, bending forward    Pain Relieving Factors  laying down of Lt side or  leaning back    Effect of Pain on Daily Activities  increases                       [facility] Adult PT Treatment/Exercise - 01/14/18 0001      Lumbar Exercises: Stretches   Active Hamstring Stretch  3 reps;30 seconds;Limitations    Active Hamstring Stretch Limitations  supine with hands behind knees    Prone on Elbows Stretch Limitations  3'; no change in pain    Press Ups  Limitations    Press Ups Limitations  increased pain      Lumbar Exercises: Supine   Bridge  5 reps    Other Supine Lumbar Exercises  pubic clearning 2 sets 5x 5"      Manual Therapy   Manual Therapy  Soft tissue mobilization;Passive ROM    Manual therapy comments  Manual complete separate than rest of tx    Soft tissue mobilization  paraspinals, glut min and med, piriformis in prone with pillow under hips    Passive ROM  passive stretches to piriformis and rectus femoris during manual                PT Short Term Goals -  12/31/17 1524      PT SHORT TERM GOAL #1   Title  Pt will be independent with HEP and perform consistently in order to decrease pain and improve overall function.     Baseline  6/13: reports compliance    Time  3    Period  Weeks    Status  Achieved      PT SHORT TERM GOAL #2   Title  Pt will have 1/2 grade improvements in MMT in order to decrease pain and allow pt to perform ADLs and IADLs with greater ease.    Time  3    Period  Weeks    Status  On-going      PT SHORT TERM GOAL #3   Title  Pt will be able to tolerate sitting for 30 mins with 5/10 LBP or < in order to allow pt to relax at home with greater ease.    Baseline  6/13: still can't be in same position for long time; can only sit for 5 mins comfortably, 10-15 mins with 5/10 LBP or less    Time  3    Period  Weeks    Status  On-going        PT Long Term Goals - 12/31/17 1527      PT LONG TERM GOAL #1   Title  Pt will have 1 grade improvement in MMT throughout all musculature tested in order to  further maximize ability to complete functional tasks at home.     Baseline  6/13: see MMT    Time  6    Period  Weeks    Status  On-going      PT LONG TERM GOAL #2   Title  Pt will be able to perform bil SLS for 30 sec with min to no unsteadiness and with minimal evidence of trunk compensations or trendelenberg sign to demo improved core and functional hip strength.    Time  6    Period  Weeks    Status  On-going      PT LONG TERM GOAL #3   Title  Pt will report being able to perform household chores and then sit for 45 mins with 3/10 LBP or < in order to further maximize her ability to perform functional tasks at home.     Baseline  6/13: has been trying to do it but can't do it long    Time  6    Period  Weeks    Status  On-going      PT LONG TERM GOAL #4   Title  Pt will report recreation of pain with 2/5 or fewer SI cluster tests to demo improved overall pain and maximize her function at home and to maximize her potential to RTW if cleared by MD.     Time  6    Period  Weeks    Status  On-going            Plan - 01/14/18 1559    Clinical Impression Statement  Session focus on manual techniques to address soft tissue restrictions.  Added passive gluteal stretches and continued wiht active hamstring stretches to address restrictions.  Noted overall tightness Lt>Rt in lumbar paraspinals, piriformis and glut med and reports of pain with pressure.  Following manual pt reoprts pain reduced to 5-6/10.  Continued with pubic clearing for core and proximal strengthening to address SI impairements.  Pt reports improvements following dry needling, resume next session.  Rehab Potential  Fair    PT Frequency  2x / week    PT Duration  6 weeks    PT Treatment/Interventions  ADLs/Self Care Home Management;Aquatic Therapy;Cryotherapy;Electrical Stimulation;Moist Heat;Traction;Ultrasound;DME Instruction;Gait training;Stair training;Functional mobility training;Therapeutic  activities;Therapeutic exercise;Balance training;Cognitive remediation;Patient/family education;Manual techniques;Passive range of motion;Dry needling;Taping;Energy conservation    PT Next Visit Plan  Resume dry needling as pt reports assistance with pain control.  Continue MFR/STM to bil glute meds (L>R) to decrease restrictions and decrease pain; STM lumbar paraspinals for pain relief; continue to address SIJ as needed; isolated glute med strengthening, continue core strength.    PT Home Exercise Plan  eval: piriformis stretch; 5/29: Isometric abdominal contraction; 6/19: hamstring stretch and SKTC       Patient will benefit from skilled therapeutic intervention in order to improve the following deficits and impairments:  Abnormal gait, Decreased activity tolerance, Decreased balance, Decreased endurance, Decreased strength, Difficulty walking, Hypomobility, Increased fascial restricitons, Increased muscle spasms, Impaired flexibility, Improper body mechanics, Postural dysfunction, Pain  Visit Diagnosis: Chronic bilateral low back pain without sciatica  Other symptoms and signs involving the musculoskeletal system  Muscle weakness (generalized)  Cervicalgia     Problem List There are no active problems to display for this patient.  [address], LPTA; Rennert  Aldona Lento 01/14/2018, 4:03 PM  [place] [address] [place], [place], [address] Phone: [phone]   Fax:  [phone]  Name: Rhonda Hawkins MRN: [id number] Date of Birth: [birth year]

## 2018-01-19 ENCOUNTER — Ambulatory Visit (HOSPITAL_COMMUNITY): Payer: Self-pay | Attending: Family Medicine

## 2018-01-19 DIAGNOSIS — M545 Low back pain, unspecified: Secondary | ICD-10-CM

## 2018-01-19 DIAGNOSIS — M542 Cervicalgia: Secondary | ICD-10-CM | POA: Insufficient documentation

## 2018-01-19 DIAGNOSIS — M6281 Muscle weakness (generalized): Secondary | ICD-10-CM | POA: Insufficient documentation

## 2018-01-19 DIAGNOSIS — G8929 Other chronic pain: Secondary | ICD-10-CM | POA: Insufficient documentation

## 2018-01-19 DIAGNOSIS — R29898 Other symptoms and signs involving the musculoskeletal system: Secondary | ICD-10-CM | POA: Insufficient documentation

## 2018-01-19 NOTE — Therapy (Signed)
[place], [place], [address] Phone: [phone]   Fax:  [phone]  Physical Therapy Treatment  Patient Details  Name: Rhonda Hawkins MRN: [id number] Date of Birth: May 17, 1976 Referring Provider: Gentry Fitz, MD   Encounter Date: 01/19/2018  PT End of Session - 01/19/18 1432    Visit Number  10    Number of Visits  13    Date for PT Re-Evaluation  01/19/18 Minireassess complete 12/31/17    Authorization Type  Self-pay    Authorization Time Period  12/08/17 to 01/19/18    PT Start Time  1431    PT Stop Time  1511    PT Time Calculation (min)  40 min    Activity Tolerance  Patient limited by pain;No increased pain;Patient tolerated treatment well Reports pain reduced at EOS    Behavior During Therapy  [facility] for tasks assessed/performed       No past medical history on file.  Past Surgical History:  Procedure Laterality Date  . DILATION AND CURETTAGE OF UTERUS    . LAPAROTOMY  02/07/2011   Procedure: LAPAROTOMY;  Surgeon: Jonnie Kind, MD;  Location: [facility];  Service: Gynecology;  Laterality: N/A;  . TUBAL LIGATION  02/07/2011   Procedure: BILATERAL TUBAL LIGATION;  Surgeon: Jonnie Kind, MD;  Location: [facility];  Service: Gynecology;  Laterality: Bilateral;  Bilateral salpingectomy    There were no vitals filed for this visit.  Subjective Assessment - 01/19/18 1432    Subjective  Pt states that she is feeling a little better today. Rates her pain as 7/10.    Patient Stated Goals  back to get better, back to working without pain in back and neck    Currently in Pain?  Yes    Pain Score  7     Pain Location  Back    Pain Orientation  Lower    Pain Descriptors / Indicators  Sharp    Pain Type  Chronic pain    Pain Onset  More than a month ago    Pain Frequency  Constant    Aggravating Factors   sitting for long periods of time, bending forward    Pain Relieving Factors  laying down of  left side or leaning back    Effect of Pain on Daily Activities  increases           [facility] Adult PT Treatment/Exercise - 01/19/18 0001      Manual Therapy   Manual Therapy  Soft tissue mobilization    Manual therapy comments  Manual complete separate than rest of tx    Soft tissue mobilization  bil lumbar paraspinals following trigger point dry needling to further decrease pain and soft tissue restrictions       Trigger Point Dry Needling - 01/19/18 1512    Consent Given?  Yes    Education Handout Provided  No    Muscles Treated Upper Body  Longissimus    Longissimus Response  Twitch response elicited;Palpable increased muscle length bil lumbar multifidis L3-5             PT Education - 01/19/18 1432    Education provided  Yes    Education Details  trigger point dry needling risks and benefits; expect some soreness    Person(s) Educated  Patient    Methods  Explanation;Demonstration    Comprehension  Verbalized understanding;Returned demonstration       PT Short Term  Goals - 12/31/17 1524      PT SHORT TERM GOAL #1   Title  Pt will be independent with HEP and perform consistently in order to decrease pain and improve overall function.     Baseline  6/13: reports compliance    Time  3    Period  Weeks    Status  Achieved      PT SHORT TERM GOAL #2   Title  Pt will have 1/2 grade improvements in MMT in order to decrease pain and allow pt to perform ADLs and IADLs with greater ease.    Time  3    Period  Weeks    Status  On-going      PT SHORT TERM GOAL #3   Title  Pt will be able to tolerate sitting for 30 mins with 5/10 LBP or < in order to allow pt to relax at home with greater ease.    Baseline  6/13: still can't be in same position for long time; can only sit for 5 mins comfortably, 10-15 mins with 5/10 LBP or less    Time  3    Period  Weeks    Status  On-going        PT Long Term Goals - 12/31/17 1527      PT LONG TERM GOAL #1   Title  Pt will  have 1 grade improvement in MMT throughout all musculature tested in order to further maximize ability to complete functional tasks at home.     Baseline  6/13: see MMT    Time  6    Period  Weeks    Status  On-going      PT LONG TERM GOAL #2   Title  Pt will be able to perform bil SLS for 30 sec with min to no unsteadiness and with minimal evidence of trunk compensations or trendelenberg sign to demo improved core and functional hip strength.    Time  6    Period  Weeks    Status  On-going      PT LONG TERM GOAL #3   Title  Pt will report being able to perform household chores and then sit for 45 mins with 3/10 LBP or < in order to further maximize her ability to perform functional tasks at home.     Baseline  6/13: has been trying to do it but can't do it long    Time  6    Period  Weeks    Status  On-going      PT LONG TERM GOAL #4   Title  Pt will report recreation of pain with 2/5 or fewer SI cluster tests to demo improved overall pain and maximize her function at home and to maximize her potential to RTW if cleared by MD.     Time  6    Period  Weeks    Status  On-going            Plan - 01/19/18 1517    Clinical Impression Statement  Resumed dry needling this date since pt has positive response following the first time performing it. PT able to get good twitch responses along bil lumbar paraspinals and multifidis (twitch response elicited in paraspinals while driving needle through them to get to multifidus). PT able to drive needle to the depth of lumbar transverse processes on the L and it was at a symmetrical depth to the R, indicating no lumbar vertebral rotation. Ended  with manual STM to bil lumbar paraspinals to further promote muscle relaxation, decreased restrictions, and decreased pain. PT educated that she will likely experience increased soreness following today's needling session and she and her dtr verbalized understanding. Pt reporting 8/10 pain at EOS. Continue  as planned, progressing as able.    Rehab Potential  Fair    PT Frequency  2x / week    PT Duration  6 weeks    PT Treatment/Interventions  ADLs/Self Care Home Management;Aquatic Therapy;Cryotherapy;Electrical Stimulation;Moist Heat;Traction;Ultrasound;DME Instruction;Gait training;Stair training;Functional mobility training;Therapeutic activities;Therapeutic exercise;Balance training;Cognitive remediation;Patient/family education;Manual techniques;Passive range of motion;Dry needling;Taping;Energy conservation    PT Next Visit Plan  cotninue dry needling as needed; core strengthening; Continue MFR/STM to bil glute meds (L>R) to decrease restrictions and decrease pain; STM lumbar paraspinals for pain relief; continue to address SIJ as needed; isolated glute med strengthening, continue core strength.    PT Home Exercise Plan  eval: piriformis stretch; 5/29: Isometric abdominal contraction; 6/19: hamstring stretch and SKTC    Consulted and Agree with Plan of Care  Patient;Family member/caregiver    Family Member Consulted  dtr       Patient will benefit from skilled therapeutic intervention in order to improve the following deficits and impairments:  Abnormal gait, Decreased activity tolerance, Decreased balance, Decreased endurance, Decreased strength, Difficulty walking, Hypomobility, Increased fascial restricitons, Increased muscle spasms, Impaired flexibility, Improper body mechanics, Postural dysfunction, Pain  Visit Diagnosis: Chronic bilateral low back pain without sciatica  Other symptoms and signs involving the musculoskeletal system  Muscle weakness (generalized)  Cervicalgia     Problem List There are no active problems to display for this patient.      Geraldine Solar PT, [place] [address] [place], [place], [address] Phone: [phone]   Fax:  [phone]  Name: Rhonda Hawkins MRN: [id number] Date of  Birth: 1975-11-20

## 2018-01-22 ENCOUNTER — Ambulatory Visit (HOSPITAL_COMMUNITY): Payer: Self-pay

## 2018-01-25 ENCOUNTER — Ambulatory Visit (HOSPITAL_COMMUNITY): Payer: Self-pay

## 2018-01-25 DIAGNOSIS — G8929 Other chronic pain: Secondary | ICD-10-CM

## 2018-01-25 DIAGNOSIS — R29898 Other symptoms and signs involving the musculoskeletal system: Secondary | ICD-10-CM

## 2018-01-25 DIAGNOSIS — M545 Low back pain, unspecified: Secondary | ICD-10-CM

## 2018-01-25 DIAGNOSIS — M542 Cervicalgia: Secondary | ICD-10-CM

## 2018-01-25 DIAGNOSIS — M6281 Muscle weakness (generalized): Secondary | ICD-10-CM
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
## 2018-01-28 ENCOUNTER — Encounter (HOSPITAL_COMMUNITY): Payer: Self-pay

## 2018-01-28 ENCOUNTER — Ambulatory Visit (HOSPITAL_COMMUNITY): Payer: Self-pay

## 2018-01-28 DIAGNOSIS — M542 Cervicalgia: Secondary | ICD-10-CM

## 2018-01-28 DIAGNOSIS — G8929 Other chronic pain: Secondary | ICD-10-CM

## 2018-01-28 DIAGNOSIS — M6281 Muscle weakness (generalized): Secondary | ICD-10-CM

## 2018-01-28 DIAGNOSIS — R29898 Other symptoms and signs involving the musculoskeletal system: Secondary | ICD-10-CM

## 2018-01-28 DIAGNOSIS — M545 Low back pain: Principal | ICD-10-CM

## 2018-01-28 NOTE — Therapy (Signed)
[place], [place], [address] Phone: [phone]   Fax:  [phone]  Physical Therapy Treatment  Patient Details  Name: Rhonda Hawkins MRN: [id number] Date of Birth: 23-Feb-1976 Referring Provider: Gentry Fitz, MD   Encounter Date: 01/28/2018  PT End of Session - 01/28/18 1433    Visit Number  12    Number of Visits  21    Date for PT Re-Evaluation  02/22/18    Authorization Type  Self-pay    Authorization Time Period  12/08/17 to 01/19/18; NEW: 01/25/18 to 02/22/18    PT Start Time  1432    PT Stop Time  1500    PT Time Calculation (min)  28 min    Activity Tolerance  Patient limited by pain;No increased pain;Patient tolerated treatment well Reports pain reduced at EOS    Behavior During Therapy  [facility] for tasks assessed/performed       No past medical history on file.  Past Surgical History:  Procedure Laterality Date  . DILATION AND CURETTAGE OF UTERUS    . LAPAROTOMY  02/07/2011   Procedure: LAPAROTOMY;  Surgeon: Jonnie Kind, MD;  Location: [facility];  Service: Gynecology;  Laterality: N/A;  . TUBAL LIGATION  02/07/2011   Procedure: BILATERAL TUBAL LIGATION;  Surgeon: Jonnie Kind, MD;  Location: [facility];  Service: Gynecology;  Laterality: Bilateral;  Bilateral salpingectomy    There were no vitals filed for this visit.  Subjective Assessment - 01/28/18 1433    Subjective  Pt states that she is so-so again today. Still having back and R hip pain. No real changes from last session.    Patient Stated Goals  back to get better, back to working without pain in back and neck    Currently in Pain?  Yes    Pain Score  7     Pain Location  Back    Pain Orientation  Lower    Pain Descriptors / Indicators  Sharp    Pain Type  Chronic pain    Pain Onset  More than a month ago    Pain Frequency  Constant    Aggravating Factors   sitting for long periods of time, bending forward    Pain Relieving  Factors  laying down on left side or leaning back    Effect of Pain on Daily Activities  increases            [facility] Adult PT Treatment/Exercise - 01/28/18 0001      Lumbar Exercises: Stretches   Piriformis Stretch  Right;3 reps;30 seconds      Lumbar Exercises: Supine   Bridge  15 reps      Manual Therapy   Manual Therapy  Soft tissue mobilization    Manual therapy comments  Manual complete separate than rest of tx    Myofascial Release  R glute med for pain control following needling       Trigger Point Dry Needling - 01/28/18 1436    Consent Given?  Yes    Education Handout Provided  No    Muscles Treated Lower Body  Gluteus maximus    Gluteus Maximus Response  Twitch response elicited;Palpable increased muscle length R glute med in sidelying             PT Education - 01/28/18 1433    Education provided  Yes    Education Details  trigger point dry needling and expect some sorenes  Person(s) Educated  Patient;Child(ren)    Methods  Explanation;Demonstration    Comprehension  Verbalized understanding;Returned demonstration       PT Short Term Goals - 01/25/18 1305      PT SHORT TERM GOAL #1   Title  Pt will be independent with HEP and perform consistently in order to decrease pain and improve overall function.     Baseline  6/13: reports compliance    Time  3    Period  Weeks    Status  Achieved      PT SHORT TERM GOAL #2   Title  Pt will have 1/2 grade improvements in MMT in order to decrease pain and allow pt to perform ADLs and IADLs with greater ease.    Baseline  7/8: see MMT    Time  3    Period  Weeks    Status  On-going      PT SHORT TERM GOAL #3   Title  Pt will be able to tolerate sitting for 30 mins with 5/10 LBP or < in order to allow pt to relax at home with greater ease.    Baseline  7/8: can sit for about 10-15 mins    Time  3    Period  Weeks    Status  On-going        PT Long Term Goals - 01/25/18 1306      PT LONG TERM  GOAL #1   Title  Pt will have 1 grade improvement in MMT throughout all musculature tested in order to further maximize ability to complete functional tasks at home.     Baseline  7/8: see MMT    Time  6    Period  Weeks    Status  On-going      PT LONG TERM GOAL #2   Title  Pt will be able to perform bil SLS for 30 sec with min to no unsteadiness and with minimal evidence of trunk compensations or trendelenberg sign to demo improved core and functional hip strength.    Time  6    Period  Weeks    Status  On-going      PT LONG TERM GOAL #3   Title  Pt will report being able to perform household chores and then sit for 45 mins with 3/10 LBP or < in order to further maximize her ability to perform functional tasks at home.     Baseline  7/8: can still only sit for 10-15 mins; can perform more household chores now, but has pain later    Time  6    Period  Weeks    Status  On-going      PT LONG TERM GOAL #4   Title  Pt will report recreation of pain with 2/5 or fewer SI cluster tests to demo improved overall pain and maximize her function at home and to maximize her potential to RTW if cleared by MD.     Baseline  7/8: 3/5 tests recreated her same pain (see objective findings)    Time  6    Period  Weeks    Status  On-going            Plan - 01/28/18 1504    Clinical Impression Statement  Dry needling to R glute med performed this date. Pt reported referred pain down her RLE and towards her R sacrum during needling, indicating those were active trigger points. Performed manual, muscle stretching, and muscle  activation following needling. Pt did report increased pain afterwards but stated it was tolerable. Educated pt to perform supine or seated piriformis stretch to stretch her glutes; she verbalized understanding. Limited session this date due to increased pain with needling this date so as to not significantly exacerbate her pain.    Rehab Potential  Fair    PT Frequency  2x / week     PT Duration  4 weeks    PT Treatment/Interventions  ADLs/Self Care Home Management;Aquatic Therapy;Cryotherapy;Electrical Stimulation;Moist Heat;Traction;Ultrasound;DME Instruction;Gait training;Stair training;Functional mobility training;Therapeutic activities;Therapeutic exercise;Balance training;Cognitive remediation;Patient/family education;Manual techniques;Passive range of motion;Dry needling;Taping;Energy conservation    PT Next Visit Plan  cotninue dry needling to paraspinals and r glute med; core strengthening; Continue MFR/STM to bil glute meds (L>R) to decrease restrictions and decrease pain; STM lumbar paraspinals for pain relief; continue to address SIJ as needed; isolated glute med and core strengthening,    PT Home Exercise Plan  eval: piriformis stretch; 5/29: Isometric abdominal contraction; 6/19: hamstring stretch and SKTC    Consulted and Agree with Plan of Care  Patient;Family member/caregiver    Family Member Consulted  dtr       Patient will benefit from skilled therapeutic intervention in order to improve the following deficits and impairments:  Abnormal gait, Decreased activity tolerance, Decreased balance, Decreased endurance, Decreased strength, Difficulty walking, Hypomobility, Increased fascial restricitons, Increased muscle spasms, Impaired flexibility, Improper body mechanics, Postural dysfunction, Pain  Visit Diagnosis: Chronic bilateral low back pain without sciatica  Other symptoms and signs involving the musculoskeletal system  Muscle weakness (generalized)  Cervicalgia     Problem List There are no active problems to display for this patient.      Geraldine Solar PT, [place] [address] [place], [place], [address] Phone: [phone]   Fax:  [phone]  Name: Rhonda Hawkins MRN: [id number] Date of Birth: 30-Nov-1975

## 2018-01-28 NOTE — Addendum Note (Signed)
Addended by: Geralyn Corwin on: 01/28/2018 10:37 AM   Modules accepted: Orders

## 2018-02-02 ENCOUNTER — Encounter (HOSPITAL_COMMUNITY): Payer: Self-pay

## 2018-02-03 ENCOUNTER — Ambulatory Visit (HOSPITAL_COMMUNITY): Payer: Self-pay

## 2018-02-03 ENCOUNTER — Encounter (HOSPITAL_COMMUNITY): Payer: Self-pay

## 2018-02-03 DIAGNOSIS — R29898 Other symptoms and signs involving the musculoskeletal system: Secondary | ICD-10-CM

## 2018-02-03 DIAGNOSIS — M6281 Muscle weakness (generalized): Secondary | ICD-10-CM

## 2018-02-03 DIAGNOSIS — G8929 Other chronic pain: Secondary | ICD-10-CM

## 2018-02-03 DIAGNOSIS — M542 Cervicalgia: Secondary | ICD-10-CM

## 2018-02-03 DIAGNOSIS — M545 Low back pain: Principal | ICD-10-CM

## 2018-02-03 NOTE — Therapy (Signed)
[place], [place], [address] Phone: [phone]   Fax:  [phone]  Physical Therapy Treatment  Patient Details  Name: Rhonda Hawkins MRN: [id number] Date of Birth: [birth year] Referring Provider: Gentry Fitz, MD   Encounter Date: 02/03/2018  PT End of Session - 02/03/18 1030    Visit Number  13    Number of Visits  21    Date for PT Re-Evaluation  02/22/18    Authorization Type  Self-pay    Authorization Time Period  12/08/17 to 01/19/18; NEW: 01/25/18 to 02/22/18    PT Start Time  1030    PT Stop Time  1114    PT Time Calculation (min)  44 min    Activity Tolerance  Patient limited by pain;No increased pain;Patient tolerated treatment well Reports pain reduced at EOS    Behavior During Therapy  [facility] for tasks assessed/performed       History reviewed. No pertinent past medical history.  Past Surgical History:  Procedure Laterality Date  . DILATION AND CURETTAGE OF UTERUS    . LAPAROTOMY  02/07/2011   Procedure: LAPAROTOMY;  Surgeon: Jonnie Kind, MD;  Location: [facility];  Service: Gynecology;  Laterality: N/A;  . TUBAL LIGATION  02/07/2011   Procedure: BILATERAL TUBAL LIGATION;  Surgeon: Jonnie Kind, MD;  Location: [facility];  Service: Gynecology;  Laterality: Bilateral;  Bilateral salpingectomy    There were no vitals filed for this visit.  Subjective Assessment - 02/03/18 1030    Subjective  Pt reports that she was sore following the needling but once that wore off she felt like it did help her pain some. She is still reporting 7/10 pain at the moment in her back, R hip and down her leg    Patient Stated Goals  back to get better, back to working without pain in back and neck    Currently in Pain?  Yes    Pain Score  7     Pain Location  Back    Pain Orientation  Lower;Right    Pain Descriptors / Indicators  Sharp    Pain Type  Chronic pain    Pain Onset  More than a month ago    Pain  Frequency  Constant    Aggravating Factors   sitting for long periods of time, bending forward    Pain Relieving Factors  laying down on left side or leaning back     Effect of Pain on Daily Activities  increases            [facility] Adult PT Treatment/Exercise - 02/03/18 0001      Lumbar Exercises: Stretches   Piriformis Stretch  Right;3 reps;30 seconds    Figure 4 Stretch  3 reps;30 seconds;Supine;With overpressure    Figure 4 Stretch Limitations  RLE      Lumbar Exercises: Supine   Bridge  10 reps      Lumbar Exercises: Sidelying   Other Sidelying Lumbar Exercises  L iso clam with manual resistance 10x5-10" holds      Manual Therapy   Manual Therapy  Soft tissue mobilization    Manual therapy comments  Manual complete separate than rest of tx    Myofascial Release  R glutes and piriformis for pain control following needling       Trigger Point Dry Needling - 02/03/18 1123    Consent Given?  Yes    Education Handout Provided  No  Muscles Treated Lower Body  Gluteus minimus;Gluteus maximus;Piriformis    Gluteus Maximus Response  Twitch response elicited;Palpable increased muscle length R in prone and sidelying    Gluteus Minimus Response  Twitch response elicited;Palpable increased muscle length R in prone    Piriformis Response  Twitch response elicited;Palpable increased muscle length R in prone            PT Education - 02/03/18 1117    Education provided  Yes    Education Details  trigger point dry needling, expect soreness, perform stretching and can use heat or ice to the area to help with pain    Person(s) Educated  Patient;Caregiver(s)    Methods  Explanation;Demonstration    Comprehension  Verbalized understanding;Returned demonstration       PT Short Term Goals - 01/25/18 1305      PT SHORT TERM GOAL #1   Title  Pt will be independent with HEP and perform consistently in order to decrease pain and improve overall function.     Baseline  6/13: reports  compliance    Time  3    Period  Weeks    Status  Achieved      PT SHORT TERM GOAL #2   Title  Pt will have 1/2 grade improvements in MMT in order to decrease pain and allow pt to perform ADLs and IADLs with greater ease.    Baseline  7/8: see MMT    Time  3    Period  Weeks    Status  On-going      PT SHORT TERM GOAL #3   Title  Pt will be able to tolerate sitting for 30 mins with 5/10 LBP or < in order to allow pt to relax at home with greater ease.    Baseline  7/8: can sit for about 10-15 mins    Time  3    Period  Weeks    Status  On-going        PT Long Term Goals - 01/25/18 1306      PT LONG TERM GOAL #1   Title  Pt will have 1 grade improvement in MMT throughout all musculature tested in order to further maximize ability to complete functional tasks at home.     Baseline  7/8: see MMT    Time  6    Period  Weeks    Status  On-going      PT LONG TERM GOAL #2   Title  Pt will be able to perform bil SLS for 30 sec with min to no unsteadiness and with minimal evidence of trunk compensations or trendelenberg sign to demo improved core and functional hip strength.    Time  6    Period  Weeks    Status  On-going      PT LONG TERM GOAL #3   Title  Pt will report being able to perform household chores and then sit for 45 mins with 3/10 LBP or < in order to further maximize her ability to perform functional tasks at home.     Baseline  7/8: can still only sit for 10-15 mins; can perform more household chores now, but has pain later    Time  6    Period  Weeks    Status  On-going      PT LONG TERM GOAL #4   Title  Pt will report recreation of pain with 2/5 or fewer SI cluster tests to demo improved  overall pain and maximize her function at home and to maximize her potential to RTW if cleared by MD.     Baseline  7/8: 3/5 tests recreated her same pain (see objective findings)    Time  6    Period  Weeks    Status  On-going            Plan - 02/03/18 1122     Clinical Impression Statement  Pt continues to subjectively report that the needling is helping decrease her pain some after each session. Continued with trigger point dry needling of pt's R hip this date, targeting all glutes and piriformis today. Referred pain down RLE elicited with needling throughout all. Ended with massage, muscle activation and stretching afterwards. Pt educated to perform piriformis stretch and supine figure 4 stretch at home and that she could apply ice or heat to her hip to assist with the pain. Next session will focus on massage and strengthening as tolerated.    Rehab Potential  Fair    PT Frequency  2x / week    PT Duration  4 weeks    PT Treatment/Interventions  ADLs/Self Care Home Management;Aquatic Therapy;Cryotherapy;Electrical Stimulation;Moist Heat;Traction;Ultrasound;DME Instruction;Gait training;Stair training;Functional mobility training;Therapeutic activities;Therapeutic exercise;Balance training;Cognitive remediation;Patient/family education;Manual techniques;Passive range of motion;Dry needling;Taping;Energy conservation    PT Next Visit Plan  isolated glute med and core strengthening; cotninue dry needling to paraspinals and r glute med; core strengthening; Continue MFR/STM to bil glute meds (L>R) to decrease restrictions and decrease pain; STM lumbar paraspinals for pain relief; continue to address SIJ as needed;    PT Home Exercise Plan  eval: piriformis stretch; 5/29: Isometric abdominal contraction; 6/19: hamstring stretch and SKTC; 7/17: supine priformis stretch, supine figure 4    Consulted and Agree with Plan of Care  Patient;Family member/caregiver    Family Member Consulted  dtr       Patient will benefit from skilled therapeutic intervention in order to improve the following deficits and impairments:  Abnormal gait, Decreased activity tolerance, Decreased balance, Decreased endurance, Decreased strength, Difficulty walking, Hypomobility, Increased  fascial restricitons, Increased muscle spasms, Impaired flexibility, Improper body mechanics, Postural dysfunction, Pain  Visit Diagnosis: Chronic bilateral low back pain without sciatica  Other symptoms and signs involving the musculoskeletal system  Muscle weakness (generalized)  Cervicalgia     Problem List There are no active problems to display for this patient.       Geraldine Solar PT, [place] [address] [place], [place], [address] Phone: [phone]   Fax:  [phone]  Name: LIVELY HABERMAN MRN: [id number] Date of Birth: 16-Feb-1976

## 2018-02-04 ENCOUNTER — Encounter (HOSPITAL_COMMUNITY): Payer: Self-pay

## 2018-02-05 ENCOUNTER — Ambulatory Visit (HOSPITAL_COMMUNITY): Payer: Self-pay

## 2018-02-09 ENCOUNTER — Ambulatory Visit (HOSPITAL_COMMUNITY): Payer: Self-pay

## 2018-02-09 ENCOUNTER — Encounter (HOSPITAL_COMMUNITY): Payer: Self-pay

## 2018-02-09 DIAGNOSIS — R29898 Other symptoms and signs involving the musculoskeletal system: Secondary | ICD-10-CM

## 2018-02-09 DIAGNOSIS — M542 Cervicalgia: Secondary | ICD-10-CM

## 2018-02-09 DIAGNOSIS — G8929 Other chronic pain: Secondary | ICD-10-CM

## 2018-02-09 DIAGNOSIS — M6281 Muscle weakness (generalized): Secondary | ICD-10-CM

## 2018-02-09 DIAGNOSIS — M545 Low back pain: Principal | ICD-10-CM

## 2018-02-09 NOTE — Therapy (Signed)
[place], [place], [address] Phone: [phone]   Fax:  [phone]  Physical Therapy Treatment  Patient Details  Name: Rhonda Hawkins MRN: [id number] Date of Birth: 1976/02/12 Referring Provider: Gentry Fitz, MD   Encounter Date: 02/09/2018  PT End of Session - 02/09/18 1347    Visit Number  14    Number of Visits  21    Date for PT Re-Evaluation  02/22/18    Authorization Type  Self-pay    Authorization Time Period  12/08/17 to 01/19/18; NEW: 01/25/18 to 02/22/18    PT Start Time  1346    PT Stop Time  1427    PT Time Calculation (min)  41 min    Activity Tolerance  Patient limited by pain;No increased pain;Patient tolerated treatment well Reports pain reduced at EOS    Behavior During Therapy  [facility] for tasks assessed/performed       History reviewed. No pertinent past medical history.  Past Surgical History:  Procedure Laterality Date  . DILATION AND CURETTAGE OF UTERUS    . LAPAROTOMY  02/07/2011   Procedure: LAPAROTOMY;  Surgeon: Jonnie Kind, MD;  Location: [facility];  Service: Gynecology;  Laterality: N/A;  . TUBAL LIGATION  02/07/2011   Procedure: BILATERAL TUBAL LIGATION;  Surgeon: Jonnie Kind, MD;  Location: [facility];  Service: Gynecology;  Laterality: Bilateral;  Bilateral salpingectomy    There were no vitals filed for this visit.  Subjective Assessment - 02/09/18 1347    Subjective  Pt reports that she is a little better, reports her pain is still a 7/10.     Patient Stated Goals  back to get better, back to working without pain in back and neck    Currently in Pain?  Yes    Pain Score  7     Pain Location  Back    Pain Orientation  Lower;Right    Pain Descriptors / Indicators  Sharp    Pain Type  Chronic pain    Pain Onset  More than a month ago    Pain Frequency  Constant    Aggravating Factors   sitting for long periods of time, bending forward    Pain Relieving Factors   laying down on left side or leaning back    Effect of Pain on Daily Activities  increases            [facility] Adult PT Treatment/Exercise - 02/09/18 0001      Lumbar Exercises: Stretches   Other Lumbar Stretch Exercise  fwd and L lateral child's pose 3x30" each    Other Lumbar Stretch Exercise  seated lumbar flexion stretch fwd and to the L 10x10" each      Lumbar Exercises: Sidelying   Clam  Right    Clam Limitations  RTB, had to stop after 4 reps due to increased pain    Other Sidelying Lumbar Exercises  R iso clams with belt 10x10" holds      Manual Therapy   Manual Therapy  Soft tissue mobilization    Manual therapy comments  Manual complete separate than rest of tx    Myofascial Release  R glutes, piriformis, L lumbar paraspinals            PT Education - 02/09/18 1422    Education provided  Yes    Education Details  updated HEP to include child's pose    Person(s) Educated  Patient  Methods  Explanation;Handout;Demonstration    Comprehension  Verbalized understanding;Returned demonstration       PT Short Term Goals - 01/25/18 1305      PT SHORT TERM GOAL #1   Title  Pt will be independent with HEP and perform consistently in order to decrease pain and improve overall function.     Baseline  6/13: reports compliance    Time  3    Period  Weeks    Status  Achieved      PT SHORT TERM GOAL #2   Title  Pt will have 1/2 grade improvements in MMT in order to decrease pain and allow pt to perform ADLs and IADLs with greater ease.    Baseline  7/8: see MMT    Time  3    Period  Weeks    Status  On-going      PT SHORT TERM GOAL #3   Title  Pt will be able to tolerate sitting for 30 mins with 5/10 LBP or < in order to allow pt to relax at home with greater ease.    Baseline  7/8: can sit for about 10-15 mins    Time  3    Period  Weeks    Status  On-going        PT Long Term Goals - 01/25/18 1306      PT LONG TERM GOAL #1   Title  Pt will have 1 grade  improvement in MMT throughout all musculature tested in order to further maximize ability to complete functional tasks at home.     Baseline  7/8: see MMT    Time  6    Period  Weeks    Status  On-going      PT LONG TERM GOAL #2   Title  Pt will be able to perform bil SLS for 30 sec with min to no unsteadiness and with minimal evidence of trunk compensations or trendelenberg sign to demo improved core and functional hip strength.    Time  6    Period  Weeks    Status  On-going      PT LONG TERM GOAL #3   Title  Pt will report being able to perform household chores and then sit for 45 mins with 3/10 LBP or < in order to further maximize her ability to perform functional tasks at home.     Baseline  7/8: can still only sit for 10-15 mins; can perform more household chores now, but has pain later    Time  6    Period  Weeks    Status  On-going      PT LONG TERM GOAL #4   Title  Pt will report recreation of pain with 2/5 or fewer SI cluster tests to demo improved overall pain and maximize her function at home and to maximize her potential to RTW if cleared by MD.     Baseline  7/8: 3/5 tests recreated her same pain (see objective findings)    Time  6    Period  Weeks    Status  On-going            Plan - 02/09/18 1428    Clinical Impression Statement  Held dry needling this date to assess pt's response to [facility] and therex. Began session by perform STM to L lumbar paraspinals and R glutes/piriformis; pt with increased pain/tenderness in glutes. Rest of session focused on gluteal stretching, strengthening, and lumbar paraspinals  stretching. Pt reported the most relief with child's pose so added this to HEP. Pt also reporting that she had increased difficulty with standing after sitting for a while since the last dry needling session; PT educated that this is likely due her significant gluteal weakness and that the needling has assisted with decreasing her restrictions/resetting her muscle.  Continue as planned, but potentially hold on the dry needling pending her response to today's session of STM and therex.    Rehab Potential  Fair    PT Frequency  2x / week    PT Duration  4 weeks    PT Treatment/Interventions  ADLs/Self Care Home Management;Aquatic Therapy;Cryotherapy;Electrical Stimulation;Moist Heat;Traction;Ultrasound;DME Instruction;Gait training;Stair training;Functional mobility training;Therapeutic activities;Therapeutic exercise;Balance training;Cognitive remediation;Patient/family education;Manual techniques;Passive range of motion;Dry needling;Taping;Energy conservation    PT Next Visit Plan  isolated glute med and core strengthening; cotninue dry needling to paraspinals and r glute med; core strengthening; Continue MFR/STM to bil glute meds (L>R) to decrease restrictions and decrease pain; STM lumbar paraspinals for pain relief; continue to address SIJ as needed;    PT Home Exercise Plan  eval: piriformis stretch; 5/29: Isometric abdominal contraction; 6/19: hamstring stretch and SKTC; 7/17: supine priformis stretch, supine figure 4; 7/23: child's pose    Consulted and Agree with Plan of Care  Patient;Family member/caregiver    Family Member Consulted  son       Patient will benefit from skilled therapeutic intervention in order to improve the following deficits and impairments:  Abnormal gait, Decreased activity tolerance, Decreased balance, Decreased endurance, Decreased strength, Difficulty walking, Hypomobility, Increased fascial restricitons, Increased muscle spasms, Impaired flexibility, Improper body mechanics, Postural dysfunction, Pain  Visit Diagnosis: Chronic bilateral low back pain without sciatica  Other symptoms and signs involving the musculoskeletal system  Muscle weakness (generalized)  Cervicalgia     Problem List There are no active problems to display for this patient.     Geraldine Solar PT, [place] [address] [place], [place], [address] Phone: [phone]   Fax:  [phone]  Name: JAZZMON PRINDLE MRN: [id number] Date of Birth: 11-23-1975

## 2018-02-09 NOTE — Patient Instructions (Signed)
BACK: Child's Pose (Sciatica)    Sintese en la posicin rodillas al pecho y extienda los brazos hacia adelante. Separe las rodillas para mas comodidad. Sostenga la posicin durante ___ respiraciones. Repita ___ veces. Realice ___ veces al da.  Copyright  VHI. All rights reserved.

## 2018-02-11 ENCOUNTER — Encounter (HOSPITAL_COMMUNITY): Payer: Self-pay

## 2018-02-11 ENCOUNTER — Ambulatory Visit (HOSPITAL_COMMUNITY): Payer: Self-pay

## 2018-02-11 DIAGNOSIS — G8929 Other chronic pain: Secondary | ICD-10-CM

## 2018-02-11 DIAGNOSIS — M542 Cervicalgia: Secondary | ICD-10-CM

## 2018-02-11 DIAGNOSIS — M545 Low back pain: Principal | ICD-10-CM

## 2018-02-11 DIAGNOSIS — R29898 Other symptoms and signs involving the musculoskeletal system: Secondary | ICD-10-CM

## 2018-02-11 DIAGNOSIS — M6281 Muscle weakness (generalized): Secondary | ICD-10-CM

## 2018-02-11 NOTE — Therapy (Signed)
[place], [place], [address] Phone: [phone]   Fax:  [phone]  Physical Therapy Treatment  Patient Details  Name: Rhonda Hawkins MRN: [id number] Date of Birth: Mar 03, 1976 Referring Provider: Gentry Fitz, MD   Encounter Date: 02/11/2018  PT End of Session - 02/11/18 0904    Visit Number  15    Number of Visits  21    Date for PT Re-Evaluation  02/22/18    Authorization Type  Self-pay    Authorization Time Period  12/08/17 to 01/19/18; NEW: 01/25/18 to 02/22/18    PT Start Time  0906 pt arrived a few mins late    PT Stop Time  0944    PT Time Calculation (min)  38 min    Activity Tolerance  Patient limited by pain;No increased pain;Patient tolerated treatment well Reports pain reduced at EOS    Behavior During Therapy  [facility] for tasks assessed/performed       History reviewed. No pertinent past medical history.  Past Surgical History:  Procedure Laterality Date  . DILATION AND CURETTAGE OF UTERUS    . LAPAROTOMY  02/07/2011   Procedure: LAPAROTOMY;  Surgeon: Jonnie Kind, MD;  Location: [facility];  Service: Gynecology;  Laterality: N/A;  . TUBAL LIGATION  02/07/2011   Procedure: BILATERAL TUBAL LIGATION;  Surgeon: Jonnie Kind, MD;  Location: [facility];  Service: Gynecology;  Laterality: Bilateral;  Bilateral salpingectomy    There were no vitals filed for this visit.  Subjective Assessment - 02/11/18 0906    Subjective  Pt reports that she still has 7/10 R hip pain with pain down her leg. She reports that the needles seem to help her pain decrease better compared to regular STM/MFR.    Patient Stated Goals  back to get better, back to working without pain in back and neck    Currently in Pain?  Yes    Pain Score  7     Pain Location  Hip    Pain Orientation  Right    Pain Descriptors / Indicators  Sharp    Pain Type  Chronic pain    Pain Onset  More than a month ago    Pain Frequency   Constant    Aggravating Factors   sitting for long periods of time, bending forward    Pain Relieving Factors  laying down on left side or leaning back    Effect of Pain on Daily Activities  increases            [facility] Adult PT Treatment/Exercise - 02/11/18 0001      Lumbar Exercises: Stretches   Figure 4 Stretch  3 reps;30 seconds;Supine;With overpressure    Figure 4 Stretch Limitations  RLE, pulling towards opposite shoulder      Lumbar Exercises: Supine   Single Leg Bridge  15 reps;Limitations    Bridge with Ball Squeeze Limitations  RLE      Manual Therapy   Manual Therapy  Soft tissue mobilization    Manual therapy comments  Manual complete separate than rest of tx    Myofascial Release  R glute max/med in sidelying after needling to decrease restrictions and pain       Trigger Point Dry Needling - 02/11/18 0913    Consent Given?  Yes    Education Handout Provided  No    Muscles Treated Lower Body  Gluteus maximus;Gluteus minimus    Gluteus Maximus Response  Twitch response elicited;Palpable increased muscle length R in sidelying; R glute med as well    Gluteus Minimus Response  Twitch response elicited;Palpable increased muscle length R in sidelying             PT Short Term Goals - 01/25/18 1305      PT SHORT TERM GOAL #1   Title  Pt will be independent with HEP and perform consistently in order to decrease pain and improve overall function.     Baseline  6/13: reports compliance    Time  3    Period  Weeks    Status  Achieved      PT SHORT TERM GOAL #2   Title  Pt will have 1/2 grade improvements in MMT in order to decrease pain and allow pt to perform ADLs and IADLs with greater ease.    Baseline  7/8: see MMT    Time  3    Period  Weeks    Status  On-going      PT SHORT TERM GOAL #3   Title  Pt will be able to tolerate sitting for 30 mins with 5/10 LBP or < in order to allow pt to relax at home with greater ease.    Baseline  7/8: can sit for  about 10-15 mins    Time  3    Period  Weeks    Status  On-going        PT Long Term Goals - 01/25/18 1306      PT LONG TERM GOAL #1   Title  Pt will have 1 grade improvement in MMT throughout all musculature tested in order to further maximize ability to complete functional tasks at home.     Baseline  7/8: see MMT    Time  6    Period  Weeks    Status  On-going      PT LONG TERM GOAL #2   Title  Pt will be able to perform bil SLS for 30 sec with min to no unsteadiness and with minimal evidence of trunk compensations or trendelenberg sign to demo improved core and functional hip strength.    Time  6    Period  Weeks    Status  On-going      PT LONG TERM GOAL #3   Title  Pt will report being able to perform household chores and then sit for 45 mins with 3/10 LBP or < in order to further maximize her ability to perform functional tasks at home.     Baseline  7/8: can still only sit for 10-15 mins; can perform more household chores now, but has pain later    Time  6    Period  Weeks    Status  On-going      PT LONG TERM GOAL #4   Title  Pt will report recreation of pain with 2/5 or fewer SI cluster tests to demo improved overall pain and maximize her function at home and to maximize her potential to RTW if cleared by MD.     Baseline  7/8: 3/5 tests recreated her same pain (see objective findings)    Time  6    Period  Weeks    Status  On-going            Plan - 02/11/18 1116    Clinical Impression Statement  Pt still with high pain scale in R hip. Pt reporting that she has more relief a  few days later following needling compared to only STM so PT resumed dry needling this date. PT able to elicit good twitch responses out of R glute max, med, and minimis this date, especially max. Again, needling recreated pt's referring pain down RLE and her pain increased to 8/10 by EOS. Ended with manual STM and muscle stretching/activation to continue to decrease pain and maximize the  muscle's efficiency following needling. Continue needling and progress strengthening as able.     Rehab Potential  Fair    PT Frequency  2x / week    PT Duration  4 weeks    PT Treatment/Interventions  ADLs/Self Care Home Management;Aquatic Therapy;Cryotherapy;Electrical Stimulation;Moist Heat;Traction;Ultrasound;DME Instruction;Gait training;Stair training;Functional mobility training;Therapeutic activities;Therapeutic exercise;Balance training;Cognitive remediation;Patient/family education;Manual techniques;Passive range of motion;Dry needling;Taping;Energy conservation    PT Next Visit Plan  isolated glute med and core strengthening; cotninue dry needling to paraspinals and r glute med; core strengthening; Continue MFR/STM to bil glute meds (L>R) to decrease restrictions and decrease pain; STM lumbar paraspinals for pain relief; continue to address SIJ as needed;    PT Home Exercise Plan  eval: piriformis stretch; 5/29: Isometric abdominal contraction; 6/19: hamstring stretch and SKTC; 7/17: supine priformis stretch, supine figure 4; 7/23: child's pose    Consulted and Agree with Plan of Care  Patient;Family member/caregiver       Patient will benefit from skilled therapeutic intervention in order to improve the following deficits and impairments:  Abnormal gait, Decreased activity tolerance, Decreased balance, Decreased endurance, Decreased strength, Difficulty walking, Hypomobility, Increased fascial restricitons, Increased muscle spasms, Impaired flexibility, Improper body mechanics, Postural dysfunction, Pain  Visit Diagnosis: Chronic bilateral low back pain without sciatica  Other symptoms and signs involving the musculoskeletal system  Muscle weakness (generalized)  Cervicalgia     Problem List There are no active problems to display for this patient.     Geraldine Solar PT, [place] [address] [place], [place],  [address] Phone: [phone]   Fax:  [phone]  Name: Rhonda Hawkins MRN: [id number] Date of Birth: [birth year]

## 2018-02-15 ENCOUNTER — Ambulatory Visit (HOSPITAL_COMMUNITY): Payer: Self-pay

## 2018-02-15 ENCOUNTER — Encounter (HOSPITAL_COMMUNITY): Payer: Self-pay

## 2018-02-17 ENCOUNTER — Encounter (HOSPITAL_COMMUNITY): Payer: Self-pay

## 2018-02-17 ENCOUNTER — Ambulatory Visit (HOSPITAL_COMMUNITY): Payer: Self-pay

## 2018-02-17 DIAGNOSIS — R29898 Other symptoms and signs involving the musculoskeletal system: Secondary | ICD-10-CM

## 2018-02-17 DIAGNOSIS — G8929 Other chronic pain: Secondary | ICD-10-CM

## 2018-02-17 DIAGNOSIS — M6281 Muscle weakness (generalized): Secondary | ICD-10-CM

## 2018-02-17 DIAGNOSIS — M542 Cervicalgia: Secondary | ICD-10-CM

## 2018-02-17 DIAGNOSIS — M545 Low back pain: Principal | ICD-10-CM

## 2018-02-17 NOTE — Therapy (Signed)
[place], [place], [address] Phone: [phone]   Fax:  [phone]  Physical Therapy Treatment  Patient Details  Name: Rhonda Hawkins MRN: [id number] Date of Birth: Jul 30, 1975 Referring Provider: Gentry Fitz, MD   Encounter Date: 02/17/2018  PT End of Session - 02/17/18 1434    Visit Number  16    Number of Visits  21    Date for PT Re-Evaluation  02/22/18    Authorization Type  Self-pay    Authorization Time Period  12/08/17 to 01/19/18; NEW: 01/25/18 to 02/22/18    PT Start Time  1433    PT Stop Time  1514    PT Time Calculation (min)  41 min    Activity Tolerance  Patient limited by pain;No increased pain;Patient tolerated treatment well Reports pain reduced at EOS    Behavior During Therapy  [facility] for tasks assessed/performed       History reviewed. No pertinent past medical history.  Past Surgical History:  Procedure Laterality Date  . DILATION AND CURETTAGE OF UTERUS    . LAPAROTOMY  02/07/2011   Procedure: LAPAROTOMY;  Surgeon: Jonnie Kind, MD;  Location: [facility];  Service: Gynecology;  Laterality: N/A;  . TUBAL LIGATION  02/07/2011   Procedure: BILATERAL TUBAL LIGATION;  Surgeon: Jonnie Kind, MD;  Location: [facility];  Service: Gynecology;  Laterality: Bilateral;  Bilateral salpingectomy    There were no vitals filed for this visit.  Subjective Assessment - 02/17/18 1434    Subjective  Pt states that she has felt better since the needling. She was sore for 2 days following but states that it was better following.     Patient Stated Goals  back to get better, back to working without pain in back and neck    Currently in Pain?  Yes    Pain Score  6     Pain Location  Hip    Pain Orientation  Right    Pain Descriptors / Indicators  Sharp    Pain Type  Chronic pain    Pain Onset  More than a month ago    Pain Frequency  Constant    Aggravating Factors   sitting for long periods of  time, bending forward    Pain Relieving Factors  laying down on L side or leaning back    Effect of Pain on Daily Activities  increases           [facility] Adult PT Treatment/Exercise - 02/17/18 0001      Lumbar Exercises: Standing   Other Standing Lumbar Exercises  bil hip diagonals with RTB x10 reps each      Lumbar Exercises: Supine   Clam  15 reps    Clam Limitations  RTB, BLE individually    Single Leg Bridge  20 reps    Bridge with Ball Squeeze Limitations  RLE      Manual Therapy   Manual Therapy  Soft tissue mobilization    Manual therapy comments  Manual complete separate than rest of tx    Myofascial Release  R glute max/med in sidelying after needling to decrease restrictions and pain       Trigger Point Dry Needling - 02/17/18 1441    Consent Given?  Yes    Education Handout Provided  No    Muscles Treated Lower Body  Gluteus maximus;Gluteus minimus    Gluteus Maximus Response  Twitch response elicited;Palpable increased muscle  length R glute max and med in sidelying    Gluteus Minimus Response  Twitch response elicited;Palpable increased muscle length R in sidelying             PT Short Term Goals - 01/25/18 1305      PT SHORT TERM GOAL #1   Title  Pt will be independent with HEP and perform consistently in order to decrease pain and improve overall function.     Baseline  6/13: reports compliance    Time  3    Period  Weeks    Status  Achieved      PT SHORT TERM GOAL #2   Title  Pt will have 1/2 grade improvements in MMT in order to decrease pain and allow pt to perform ADLs and IADLs with greater ease.    Baseline  7/8: see MMT    Time  3    Period  Weeks    Status  On-going      PT SHORT TERM GOAL #3   Title  Pt will be able to tolerate sitting for 30 mins with 5/10 LBP or < in order to allow pt to relax at home with greater ease.    Baseline  7/8: can sit for about 10-15 mins    Time  3    Period  Weeks    Status  On-going        PT  Long Term Goals - 01/25/18 1306      PT LONG TERM GOAL #1   Title  Pt will have 1 grade improvement in MMT throughout all musculature tested in order to further maximize ability to complete functional tasks at home.     Baseline  7/8: see MMT    Time  6    Period  Weeks    Status  On-going      PT LONG TERM GOAL #2   Title  Pt will be able to perform bil SLS for 30 sec with min to no unsteadiness and with minimal evidence of trunk compensations or trendelenberg sign to demo improved core and functional hip strength.    Time  6    Period  Weeks    Status  On-going      PT LONG TERM GOAL #3   Title  Pt will report being able to perform household chores and then sit for 45 mins with 3/10 LBP or < in order to further maximize her ability to perform functional tasks at home.     Baseline  7/8: can still only sit for 10-15 mins; can perform more household chores now, but has pain later    Time  6    Period  Weeks    Status  On-going      PT LONG TERM GOAL #4   Title  Pt will report recreation of pain with 2/5 or fewer SI cluster tests to demo improved overall pain and maximize her function at home and to maximize her potential to RTW if cleared by MD.     Baseline  7/8: 3/5 tests recreated her same pain (see objective findings)    Time  6    Period  Weeks    Status  On-going            Plan - 02/17/18 1515    Clinical Impression Statement  Continued with dry needling since pt verbalizes subjective reports of improvements 2 days afterwards. Good twitch responses elicited throughout glute max/med/min this date.  Followed up with manual STM to further decrease pain and restrictions. Ended with isolated glute strengthening/muscle activation to further decrease pain. Pt due for reassessment next visit.    Rehab Potential  Fair    PT Frequency  2x / week    PT Duration  4 weeks    PT Treatment/Interventions  ADLs/Self Care Home Management;Aquatic Therapy;Cryotherapy;Electrical  Stimulation;Moist Heat;Traction;Ultrasound;DME Instruction;Gait training;Stair training;Functional mobility training;Therapeutic activities;Therapeutic exercise;Balance training;Cognitive remediation;Patient/family education;Manual techniques;Passive range of motion;Dry needling;Taping;Energy conservation    PT Next Visit Plan  reassessment    PT Home Exercise Plan  eval: piriformis stretch; 5/29: Isometric abdominal contraction; 6/19: hamstring stretch and SKTC; 7/17: supine priformis stretch, supine figure 4; 7/23: child's pose    Consulted and Agree with Plan of Care  Patient;Family member/caregiver       Patient will benefit from skilled therapeutic intervention in order to improve the following deficits and impairments:  Abnormal gait, Decreased activity tolerance, Decreased balance, Decreased endurance, Decreased strength, Difficulty walking, Hypomobility, Increased fascial restricitons, Increased muscle spasms, Impaired flexibility, Improper body mechanics, Postural dysfunction, Pain  Visit Diagnosis: Chronic bilateral low back pain without sciatica  Other symptoms and signs involving the musculoskeletal system  Muscle weakness (generalized)  Cervicalgia     Problem List There are no active problems to display for this patient.      Geraldine Solar PT, [place] [address] [place], [place], [address] Phone: [phone]   Fax:  [phone]  Name: Rhonda Hawkins MRN: [id number] Date of Birth: 12/03/1975

## 2018-02-18 ENCOUNTER — Encounter (HOSPITAL_COMMUNITY): Payer: Self-pay

## 2018-02-18 ENCOUNTER — Ambulatory Visit (HOSPITAL_COMMUNITY): Payer: Self-pay | Attending: Family Medicine

## 2018-02-18 DIAGNOSIS — M545 Low back pain, unspecified: Secondary | ICD-10-CM

## 2018-02-18 DIAGNOSIS — R29898 Other symptoms and signs involving the musculoskeletal system: Secondary | ICD-10-CM | POA: Insufficient documentation

## 2018-02-18 DIAGNOSIS — G8929 Other chronic pain: Secondary | ICD-10-CM | POA: Insufficient documentation

## 2018-02-18 DIAGNOSIS — M6281 Muscle weakness (generalized): Secondary | ICD-10-CM | POA: Insufficient documentation

## 2018-02-18 DIAGNOSIS — M542 Cervicalgia: Secondary | ICD-10-CM | POA: Insufficient documentation
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
## 2018-02-19 ENCOUNTER — Encounter (HOSPITAL_COMMUNITY): Payer: Self-pay

## 2018-03-18 ENCOUNTER — Ambulatory Visit (HOSPITAL_COMMUNITY): Payer: Self-pay

## 2018-03-18 ENCOUNTER — Encounter (HOSPITAL_COMMUNITY): Payer: Self-pay

## 2018-03-18 NOTE — Therapy (Signed)
[place], [place], [address] Phone: [phone]   Fax:  [phone]  Patient Details  Name: Rhonda Hawkins MRN: [id number] Date of Birth: 1975-11-14 Referring Provider:  No ref. provider found  Encounter Date: 03/18/2018   PHYSICAL THERAPY DISCHARGE SUMMARY  Visits from Start of Care: 17  Current functional level related to goals / functional outcomes: See last treatment note   Remaining deficits: See last treatment note   Education / Equipment: n/a  Plan: Patient agrees to discharge.  Patient goals were not met. Patient is being discharged due to lack of progress.  ?????      Geraldine Solar PT, [place] [address] [place], [place], [address] Phone: [phone]   Fax:  [phone]
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
## 2018-07-05 ENCOUNTER — Other Ambulatory Visit: Payer: Self-pay

## 2018-07-05 ENCOUNTER — Encounter (HOSPITAL_COMMUNITY): Payer: Self-pay | Admitting: Emergency Medicine

## 2018-07-05 ENCOUNTER — Emergency Department (HOSPITAL_COMMUNITY): Payer: Self-pay

## 2018-07-05 ENCOUNTER — Emergency Department (HOSPITAL_COMMUNITY)
Admission: EM | Admit: 2018-07-05 | Discharge: 2018-07-05 | Disposition: A | Discharge status: Home / Self Care | Payer: Self-pay | Attending: Emergency Medicine | Admitting: Emergency Medicine

## 2018-07-05 DIAGNOSIS — D259 Leiomyoma of uterus, unspecified: Secondary | ICD-10-CM

## 2018-07-05 DIAGNOSIS — E119 Type 2 diabetes mellitus without complications: Secondary | ICD-10-CM

## 2018-07-05 HISTORY — DX: Type 2 diabetes mellitus without complications: E11.9

## 2018-07-05 LAB — CBC
HCT: 46.7 % — ABNORMAL HIGH (ref 36.0–46.0)
Hemoglobin: 15.7 g/dL — ABNORMAL HIGH (ref 12.0–15.0)
MCH: 28.9 pg (ref 26.0–34.0)
MCHC: 33.6 g/dL (ref 30.0–36.0)
MCV: 86 fL (ref 80.0–100.0)
NRBC: 0 % (ref 0.0–0.2)
Platelets: 231 10*3/uL (ref 150–400)
RBC: 5.43 MIL/uL — ABNORMAL HIGH (ref 3.87–5.11)
RDW: 11.6 % (ref 11.5–15.5)
WBC: 10.2 10*3/uL (ref 4.0–10.5)

## 2018-07-05 LAB — COMPREHENSIVE METABOLIC PANEL
ALT: 30 U/L (ref 0–44)
AST: 21 U/L (ref 15–41)
Albumin: 4.2 g/dL (ref 3.5–5.0)
Alkaline Phosphatase: 67 U/L (ref 38–126)
Anion gap: 13 (ref 5–15)
BUN: 10 mg/dL (ref 6–20)
CO2: 18 mmol/L — ABNORMAL LOW (ref 22–32)
Calcium: 8.7 mg/dL — ABNORMAL LOW (ref 8.9–10.3)
Chloride: 103 mmol/L (ref 98–111)
Creatinine, Ser: 0.51 mg/dL (ref 0.44–1.00)
GFR calc Af Amer: >60 mL/min (ref >60)
GFR calc non Af Amer: >60 mL/min (ref >60)
Glucose, Bld: 431 mg/dL — ABNORMAL HIGH (ref 70–99)
Potassium: 3.9 mmol/L (ref 3.5–5.1)
Sodium: 134 mmol/L — ABNORMAL LOW (ref 135–145)
Total Bilirubin: 1.2 mg/dL (ref 0.3–1.2)
Total Protein: 7.8 g/dL (ref 6.5–8.1)

## 2018-07-05 LAB — URINALYSIS, ROUTINE W REFLEX MICROSCOPIC
Bacteria, UA: NONE SEEN
Bilirubin Urine: NEGATIVE
Hgb urine dipstick: NEGATIVE
Ketones, ur: 20 mg/dL — AB
LEUKOCYTES UA: NEGATIVE
Nitrite: NEGATIVE
Protein, ur: NEGATIVE mg/dL
Specific Gravity, Urine: 1.039 — ABNORMAL HIGH (ref 1.005–1.030)
pH: 5 (ref 5.0–8.0)

## 2018-07-05 LAB — LIPASE, BLOOD: LIPASE: 54 U/L — AB (ref 11–51)

## 2018-07-05 LAB — PREGNANCY, URINE: PREG TEST UR: NEGATIVE

## 2018-07-05 LAB — CBG MONITORING, ED
Glucose-Capillary: 451 mg/dL — ABNORMAL HIGH (ref 70–99)
Glucose-Capillary: 293 mg/dL — ABNORMAL HIGH (ref 70–99)

## 2018-07-05 MED ORDER — MORPHINE SULFATE (PF) 4 MG/ML IV SOLN
4.0000 mg | Freq: Once | INTRAVENOUS | Status: AC
  Administered 2018-07-05: 4 mg via INTRAVENOUS
  Filled 2018-07-05: qty 1

## 2018-07-05 MED ORDER — INSULIN ASPART 100 UNIT/ML ~~LOC~~ SOLN
4.0000 [IU] | Freq: Once | SUBCUTANEOUS | Status: AC
  Administered 2018-07-05: 4 [IU] via SUBCUTANEOUS
  Filled 2018-07-05: qty 1

## 2018-07-05 MED ORDER — METFORMIN HCL 500 MG PO TABS: 500.0000 mg | ORAL_TABLET | Freq: Two times a day (BID) | ORAL | 0 refills | Status: DC

## 2018-07-05 MED ORDER — NAPROXEN 500 MG PO TABS: 500.0000 mg | ORAL_TABLET | Freq: Two times a day (BID) | ORAL | 0 refills | Status: DC

## 2018-07-05 MED ORDER — IOPAMIDOL (ISOVUE-300) INJECTION 61%
100.0000 mL | Freq: Once | INTRAVENOUS | Status: AC | PRN
  Administered 2018-07-05: 100 mL via INTRAVENOUS

## 2018-07-05 MED ORDER — ONDANSETRON HCL 4 MG/2ML IJ SOLN
4.0000 mg | Freq: Once | INTRAMUSCULAR | Status: AC
  Administered 2018-07-05: 4 mg via INTRAVENOUS
  Filled 2018-07-05: qty 2

## 2018-07-05 MED ORDER — SODIUM CHLORIDE 0.9 % IV BOLUS
1000.0000 mL | Freq: Once | INTRAVENOUS | Status: AC
  Administered 2018-07-05: 1000 mL via INTRAVENOUS

## 2018-07-05 MED ORDER — ONDANSETRON 8 MG PO TBDP: 8.0000 mg | ORAL_TABLET | Freq: Three times a day (TID) | ORAL | 0 refills | Status: DC | PRN

## 2018-07-05 MED ORDER — ONDANSETRON 4 MG PO TBDP
4.0000 mg | ORAL_TABLET | Freq: Once | ORAL | Status: AC
  Administered 2018-07-05: 4 mg via ORAL
  Filled 2018-07-05: qty 1

## 2018-07-05 MED ORDER — IOPAMIDOL (ISOVUE-300) INJECTION 61%
30.0000 mL | Freq: Once | INTRAVENOUS | Status: AC | PRN
  Administered 2018-07-05: 30 mL via ORAL

## 2018-07-05 NOTE — ED Triage Notes (Signed)
Pt c/o abdominal pain with n/v/d x 3 days with subjective fever. Pt Spanish speaking, family at bedside for translation.

## 2018-07-05 NOTE — ED Provider Notes (Signed)
[facility] EMERGENCY DEPARTMENT Provider Note   CSN: [id number] Arrival date & time: 07/05/18  1155  History   Chief Complaint Chief Complaint  Patient presents with  . Abdominal Pain    HPI Rhonda Hawkins is a 42 y.o. female with PMH of T2DM presenting with nausea vomiting diarrhea fever for past 2-3 days. She localizes abdominal pain to RLQ. She reports these symptoms started 2-3 days ago but have gotten worse and are now unbearable. She was unable to eat or drink today and vomited 4-5 times. She is having loose stools. No blood in stools or vomit. She has T2DM but is not on any medication. She only takes ibuprofen OTC, no other meds. She does not have a PCP.   HPI  Past Medical History:  Diagnosis Date  . Diabetes mellitus without complication ([place])     There are no active problems to display for this patient.   Past Surgical History:  Procedure Laterality Date  . DILATION AND CURETTAGE OF UTERUS    . LAPAROTOMY  02/07/2011   Procedure: LAPAROTOMY;  Surgeon: Jonnie Kind, MD;  Location: [facility];  Service: Gynecology;  Laterality: N/A;  . TUBAL LIGATION  02/07/2011   Procedure: BILATERAL TUBAL LIGATION;  Surgeon: Jonnie Kind, MD;  Location: [facility];  Service: Gynecology;  Laterality: Bilateral;  Bilateral salpingectomy     OB History    Gravida      Para      Term      Preterm      AB      Living  5     SAB      TAB      Ectopic      Multiple      Live Births               Home Medications    Prior to Admission medications   Medication Sig Start Date End Date Taking? Authorizing Provider  ibuprofen (ADVIL,MOTRIN) 200 MG tablet Take 600 mg by mouth every 6 (six) hours as needed. For pain    Yes [provider]    Family History Family History  Problem Relation Age of Onset  . Anesthesia problems Mother        difficulty awakening after anesthesia  . Anesthesia problems Sister        nausea and vomiting     Social History Social History   Tobacco Use  . Smoking status: Never Smoker  . Smokeless tobacco: Never Used  Substance Use Topics  . Alcohol use: No  . Drug use: No     Allergies   Patient has no known allergies.   Review of Systems Review of Systems  Constitutional: Positive for activity change, appetite change, chills and fever.  HENT: Negative for congestion and sore throat.   Eyes: Negative for photophobia and visual disturbance.  Respiratory: Negative for cough and shortness of breath.   Cardiovascular: Negative for chest pain, palpitations and leg swelling.  Gastrointestinal: Positive for abdominal pain, diarrhea, nausea and vomiting. Negative for abdominal distention, blood in stool and constipation.  Genitourinary: Negative for decreased urine volume and dysuria.  Musculoskeletal: Negative for back pain and myalgias.  Skin: Negative for rash.  Neurological: Negative for weakness and headaches.     Physical Exam Updated Vital Signs BP 112/81 (BP Location: Left Arm)   Pulse 95   Temp 97.7 F (36.5 C) (Oral)   Resp 16   Ht 5' (1.524 m)  Wt 51.3 kg   LMP 06/07/2018 (Approximate)   SpO2 99%   BMI 22.07 kg/m   Physical Exam Constitutional:      General: She is in acute distress.     Appearance: She is not toxic-appearing.  HENT:     Head: Normocephalic.  Cardiovascular:     Rate and Rhythm: Tachycardia present.     Heart sounds: No murmur.  Pulmonary:     Effort: Pulmonary effort is normal.     Breath sounds: Normal breath sounds.  Abdominal:     General: There is no distension.     Tenderness: There is abdominal tenderness in the right lower quadrant. There is guarding and rebound. Positive signs include McBurney's sign. Negative signs include Murphy's sign.  Skin:    General: Skin is warm and dry.  Neurological:     General: No focal deficit present.  Psychiatric:        Mood and Affect: Mood is anxious.      ED Treatments / Results   Labs (all labs ordered are listed, but only abnormal results are displayed) Labs Reviewed  LIPASE, BLOOD - Abnormal; Notable for the following components:      Result Value   Lipase 54 (*)    All other components within normal limits  COMPREHENSIVE METABOLIC PANEL - Abnormal; Notable for the following components:   Sodium 134 (*)    CO2 18 (*)    Glucose, Bld 431 (*)    Calcium 8.7 (*)    All other components within normal limits  CBC - Abnormal; Notable for the following components:   RBC 5.43 (*)    Hemoglobin 15.7 (*)    HCT 46.7 (*)    All other components within normal limits  URINALYSIS, ROUTINE W REFLEX MICROSCOPIC - Abnormal; Notable for the following components:   Specific Gravity, Urine 1.039 (*)    Glucose, UA >=500 (*)    Ketones, ur 20 (*)    All other components within normal limits  CBG MONITORING, ED - Abnormal; Notable for the following components:   Glucose-Capillary 451 (*)    All other components within normal limits  PREGNANCY, URINE    EKG None  Radiology No results found.  Procedures Procedures (including critical care time)  Medications Ordered in ED Medications  iopamidol (ISOVUE-300) 61 % injection 30 mL (has no administration in time range)  sodium chloride 0.9 % bolus 1,000 mL (1,000 mLs Intravenous New Bag/Given 07/05/18 1344)  morphine 4 MG/ML injection 4 mg (4 mg Intravenous Given 07/05/18 1345)  ondansetron (ZOFRAN) injection 4 mg (4 mg Intravenous Given 07/05/18 1345)  insulin aspart (novoLOG) injection 4 Units (4 Units Subcutaneous Given 07/05/18 1441)  morphine 4 MG/ML injection 4 mg (4 mg Intravenous Given 07/05/18 1443)     Initial Impression / Assessment and Plan / ED Course  I have reviewed the triage vital signs and the nursing notes.  Pertinent labs & imaging results that were available during my care of the patient were reviewed by me and considered in my medical decision making (see chart for details).     42 year  old with PMH T2DM with abdominal pain, n/v/d, fever. Upreg negative, UA w/ >500 glu and 20 ketones, CBG 451. TTP RLQ with guarding and rebound, nauseated and vomiting in room. Will give 1L NS bolus, zofran, morphine and obtain CT of abdomen due to concern for appendicitis. CMP, lipase, CBC pending. 4u insulin ordered for elevated CBG.  CMP w/ elevated glucose,  normal sodium once corrected for hyperglycemia, normal kidney function, no anion gap. Lipase mildly elevated at 54. Patient with significant discomfort still, another dose of morphine given.  CT abdomen pelvis pending, care handed off to Dr. Reather Converse.     Final Clinical Impressions(s) / ED Diagnoses   Final diagnoses:  None    ED Discharge Orders    None       Steve Rattler, DO 07/05/18 1503    Elnora Morrison, MD 07/05/18 1635

## 2018-07-05 NOTE — ED Provider Notes (Signed)
Pt seen earlier by morning team.    Plan is for CT scan of her abdomen.   She is diabetic and has hyperglycemia.  Pt is not currently on meds. Physical Exam  BP 110/78   Pulse (!) 105   Temp 97.7 F (36.5 C) (Oral)   Resp 16   Ht 1.524 m (5')   Wt 51.3 kg   LMP 06/07/2018 (Approximate)   SpO2 96%   BMI 22.07 kg/m   Physical Exam Patient has mild tenderness in the suprapubic area on repeat exam ED Course/Procedures    Blood sugar has improved.  Most recent is 293. Procedures  MDM  Dispo per CT scan. Will need to rx for her diabetes and require outpt follow up.  5:27 PM CT scan shows uterine fibroids.  This may be causing the patient's pain and discomfort.  Plan on discharge home with NSAIDs and GYN follow-up.  Patient also has diabetes.  She has known about this for the last year but has not been on any medications.  I counseled the patient on the importance of starting diabetes medications and following up with a doctor regularly.  DC meds metformin and naprosyn    Dorie Rank, MD 07/05/18 1728

## 2018-07-05 NOTE — Discharge Instructions (Addendum)
Make sure to follow-up with a primary care doctor regarding your diabetes.  It is important for you to start checking your blood sugar regularly, start an appropriate diet, and to monitor your medications until you are on the appropriate regimen.  Asegrese de hacer un seguimiento con un mdico de atencin primaria con respecto a su diabetes. Es importante que comience a Scientist, water quality de Education administrator, comience una dieta [place] y controle sus medicamentos hasta que est en el rgimen [place].

## 2019-12-15 ENCOUNTER — Encounter: Payer: Self-pay | Admitting: General Practice

## 2019-12-15 DIAGNOSIS — D649 Anemia, unspecified: Secondary | ICD-10-CM | POA: Insufficient documentation

## 2020-01-02 ENCOUNTER — Ambulatory Visit (INDEPENDENT_AMBULATORY_CARE_PROVIDER_SITE_OTHER): Payer: Self-pay | Admitting: Obstetrics and Gynecology

## 2020-01-02 ENCOUNTER — Encounter: Payer: Self-pay | Admitting: Obstetrics and Gynecology

## 2020-01-02 VITALS — BP 108/70 | HR 100 | Wt 113.0 lb

## 2020-01-02 DIAGNOSIS — D251 Intramural leiomyoma of uterus: Secondary | ICD-10-CM

## 2020-01-02 DIAGNOSIS — D252 Subserosal leiomyoma of uterus: Secondary | ICD-10-CM

## 2020-01-02 NOTE — Progress Notes (Signed)
Patient ID: Rhonda Hawkins, female   DOB: 11-14-1975, 44 y.o.   MRN: [id number]    [facility] Visit  @DATE @            Patient name: Rhonda Hawkins MRN [id number]  Date of birth: 22-Aug-1975  CC & HPI:  Rhonda Hawkins is a 44 y.o. female NEW PATIENT presenting today for pelvic pain. She had a CT abdomen/pelvis with contrast on 07/05/2018 which revealed "Uterus is well visualized with a large anterior fundal fibroid identified. It measures at least 6.3 cm in greatest dimension." She was hesitant to follow up but the pain has become unbearable. The pain begins on the left side and moves to the right side. She is able to palpate the fibroid herself. The patient cannot think of anything that makes the pain worse or better. The patient denies fever, chills or any other symptoms or complaints at this time. She is accompanied by an interpreter (Spanish).   ROS:  ROS  + pelvic pain - fever - chills All systems are negative except as noted in the HPI and PMH.   Pertinent History Reviewed:   Reviewed:  Medical         Past Medical History:  Diagnosis Date  . Diabetes mellitus without complication [facility])                               Surgical Hx:    Past Surgical History:  Procedure Laterality Date  . DILATION AND CURETTAGE OF UTERUS    . LAPAROTOMY  02/07/2011   Procedure: LAPAROTOMY;  Surgeon: Jonnie Kind, MD;  Location: [facility];  Service: Gynecology;  Laterality: N/A;  . TUBAL LIGATION  02/07/2011   Procedure: BILATERAL TUBAL LIGATION;  Surgeon: Jonnie Kind, MD;  Location: [facility];  Service: Gynecology;  Laterality: Bilateral;  Bilateral salpingectomy   Medications: Reviewed & Updated - see associated section                       Current Outpatient Medications:  .  ERGOCALCIFEROL PO, Take by mouth., Disp: , Rfl:  .  fenofibrate (TRICOR) 145 MG tablet, Take 145 mg by mouth daily., Disp: , Rfl:  .  ferrous sulfate 324 MG TBEC, Take 324 mg  by mouth., Disp: , Rfl:  .  metFORMIN (GLUCOPHAGE) 500 MG tablet, Take 1 tablet (500 mg total) by mouth 2 (two) times daily with a meal., Disp: 60 tablet, Rfl: 0 .  naproxen (NAPROSYN) 500 MG tablet, Take 1 tablet (500 mg total) by mouth 2 (two) times daily with a meal. As needed for pain, Disp: 20 tablet, Rfl: 0 .  Pediatric Multivit-Minerals-C (RA GUMMY VITAMINS & MINERALS PO), Take by mouth., Disp: , Rfl:  .  ibuprofen (ADVIL,MOTRIN) 200 MG tablet, Take 600 mg by mouth every 6 (six) hours as needed. For pain  (Patient not taking: Reported on 01/02/2020), Disp: , Rfl:  .  ondansetron (ZOFRAN ODT) 8 MG disintegrating tablet, Take 1 tablet (8 mg total) by mouth every 8 (eight) hours as needed for nausea or vomiting. (Patient not taking: Reported on 01/02/2020), Disp: 12 tablet, Rfl: 0   Social History: Reviewed -  reports that she has never smoked. She has never used smokeless tobacco.  Objective Findings:  Vitals: Blood pressure 108/70, pulse 100, weight 113 lb (51.3 kg), last menstrual period 12/05/2019.  PHYSICAL EXAMINATION General appearance - alert,  well appearing, and in no distress and oriented to person, place, and time Mental status - alert, oriented to person, place, and time, normal mood, behavior, speech, dress, motor activity, and thought processes, affect appropriate to mood  PELVIC Uterus - Fibroid. Mobile, anteflexed.16 wk size.  Assessment & Plan:   A:  1.  Uterine fibroid 16 wk 2. Discussed hysterectomy vs only fibroid removal  P:  1. Schedule [place] at [facility] 2. Follow up in 2 weeks to complete preop for needed hysterectomy NEEds pap, gc chl, and u/s pelvis.  By signing my name below, I, [place], attest that this documentation has been prepared under the direction and in the presence of Jonnie Kind, MD. Electronically Signed: De Burrs, Medical Scribe. 01/02/20. 4:20 PM.  /I personally performed the services described in this documentation, which was  SCRIBED in my presence. The recorded information has been reviewed and considered accurate. It has been edited as necessary during review. Jonnie Kind, MD

## 2020-01-11 ENCOUNTER — Other Ambulatory Visit: Payer: Self-pay | Admitting: *Deleted

## 2020-01-11 DIAGNOSIS — R102 Pelvic and perineal pain: Secondary | ICD-10-CM

## 2020-01-11 DIAGNOSIS — D259 Leiomyoma of uterus, unspecified: Secondary | ICD-10-CM

## 2020-01-17 ENCOUNTER — Ambulatory Visit: Payer: Self-pay | Admitting: Obstetrics and Gynecology

## 2020-01-18 ENCOUNTER — Other Ambulatory Visit: Payer: Self-pay

## 2020-01-18 ENCOUNTER — Ambulatory Visit (HOSPITAL_COMMUNITY)
Admission: RE | Admit: 2020-01-18 | Discharge: 2020-01-18 | Disposition: A | Discharge status: Home / Self Care | Payer: Self-pay | Source: Ambulatory Visit | Attending: Obstetrics and Gynecology | Admitting: Obstetrics and Gynecology

## 2020-01-18 DIAGNOSIS — R102 Pelvic and perineal pain: Secondary | ICD-10-CM | POA: Insufficient documentation

## 2020-01-18 DIAGNOSIS — D259 Leiomyoma of uterus, unspecified: Secondary | ICD-10-CM | POA: Insufficient documentation

## 2020-01-19 ENCOUNTER — Other Ambulatory Visit: Payer: Self-pay | Admitting: Obstetrics and Gynecology

## 2020-01-19 ENCOUNTER — Ambulatory Visit (INDEPENDENT_AMBULATORY_CARE_PROVIDER_SITE_OTHER): Payer: Self-pay | Admitting: Obstetrics and Gynecology

## 2020-01-19 ENCOUNTER — Encounter: Payer: Self-pay | Admitting: Obstetrics and Gynecology

## 2020-01-19 ENCOUNTER — Other Ambulatory Visit (HOSPITAL_COMMUNITY)
Admission: RE | Admit: 2020-01-19 | Discharge: 2020-01-19 | Disposition: A | Discharge status: Home / Self Care | Payer: Self-pay | Source: Ambulatory Visit | Attending: Obstetrics and Gynecology | Admitting: Obstetrics and Gynecology

## 2020-01-19 VITALS — BP 99/64 | HR 105 | Ht 59.5 in | Wt 117.4 lb

## 2020-01-19 DIAGNOSIS — Z113 Encounter for screening for infections with a predominantly sexual mode of transmission: Secondary | ICD-10-CM

## 2020-01-19 DIAGNOSIS — R102 Pelvic and perineal pain: Secondary | ICD-10-CM

## 2020-01-19 DIAGNOSIS — D259 Leiomyoma of uterus, unspecified: Secondary | ICD-10-CM

## 2020-01-19 DIAGNOSIS — Z603 Acculturation difficulty: Secondary | ICD-10-CM

## 2020-01-19 MED ORDER — TRAMADOL HCL 50 MG PO TABS: 50.0000 mg | ORAL_TABLET | Freq: Four times a day (QID) | ORAL | 0 refills | Status: DC | PRN

## 2020-01-19 NOTE — Progress Notes (Signed)
PATIENT ID: Rhonda Hawkins, female     DOB: 1976-03-16, 44 y.o.     MRN: [id number]   [facility] Visit  01/19/20     PATIENT NAME: Rhonda Hawkins     MRN [id number]     DOB: 12-Apr-1976  CC & HPI:   Chief Complaint  Patient presents with   Fibroids   Rhonda Hawkins is a 44 y.o. female presenting today to discuss and evaluate her fibroids. She is accompanied by an [place], [phone].  She states that her pain is hurting a lot. It hurts on a daily basis and all day long. It even hurts at night and keeps her awake at night.   ROS:  Review of Systems  Constitutional: Negative.   HENT: Negative.   Eyes: Negative.   Respiratory: Negative.   Cardiovascular: Negative.   Gastrointestinal: Positive for abdominal pain.  Genitourinary: Negative.   Musculoskeletal: Negative.   Skin: Negative.   Neurological: Negative.   Endo/Heme/Allergies: Negative.   Psychiatric/Behavioral: Negative.   All other systems reviewed and are negative.  01/18/2020 TV/TA U/S revealed: Two uterine leiomyomata, 6.9 cm anterior upper/mid uterus and 3.0 cm posterior mid uterus, both of which extend submucosal. The larger leiomyoma corresponds to the mass identified on prior CT, grossly unchanged.  Pertinent History Reviewed:  Reviewed: Significant for fibroids Medical         Past Medical History:  Diagnosis Date   Diabetes mellitus without complication ([place])                               Surgical Hx:    Past Surgical History:  Procedure Laterality Date   DILATION AND CURETTAGE OF UTERUS     LAPAROTOMY  02/07/2011   Procedure: LAPAROTOMY;  Surgeon: Jonnie Kind, MD;  Location: [facility];  Service: Gynecology;  Laterality: N/A;   TUBAL LIGATION  02/07/2011   Procedure: BILATERAL TUBAL LIGATION;  Surgeon: Jonnie Kind, MD;  Location: [facility];  Service: Gynecology;  Laterality: Bilateral;  Bilateral salpingectomy   Medications: Reviewed & Updated  - see associated section                       Current Outpatient Medications:    ERGOCALCIFEROL PO, Take by mouth., Disp: , Rfl:    fenofibrate (TRICOR) 145 MG tablet, Take 145 mg by mouth daily., Disp: , Rfl:    ferrous sulfate 324 MG TBEC, Take 324 mg by mouth., Disp: , Rfl:    metFORMIN (GLUCOPHAGE) 500 MG tablet, Take 1 tablet (500 mg total) by mouth 2 (two) times daily with a meal., Disp: 60 tablet, Rfl: 0   naproxen (NAPROSYN) 500 MG tablet, Take 1 tablet (500 mg total) by mouth 2 (two) times daily with a meal. As needed for pain, Disp: 20 tablet, Rfl: 0   Pediatric Multivit-Minerals-C (RA GUMMY VITAMINS & MINERALS PO), Take by mouth., Disp: , Rfl:    Social History: Reviewed -  reports that she has never smoked. She has never used smokeless tobacco.  Objective Findings:  Vitals: Blood pressure 99/64, pulse (!) 105, height 4' 11.5" (1.511 m), weight 117 lb 6.4 oz (53.3 kg).  PHYSICAL EXAMINATION General appearance - alert, well appearing, and in no distress, oriented to person, place, and time, normal appearing weight and well hydrated Mental status - alert, oriented to person, place, and time, normal mood, behavior, speech, dress,  motor activity, and thought processes, affect appropriate to mood Chest - not examined Heart - not examined Abdomen - tender to palpation Breasts - not examined Skin - normal coloration and turgor, no rashes, no suspicious skin lesions noted  PELVIC External genitalia - normal Vulva - normal  Vagina - normal secretions and walls  Cervix - multiparous cervix, clear mucus  Uterus - Reaches up to her umbilicus on palpation of abdomen Adnexa - no masses . Uterus hard , uncomfortable to patient [place] - n/a GC Chl collected Rectal - rectal exam not indicated  Assessment & Plan:   A:  1. Endometrial biopsy today Endometrial Biopsy: Patient given informed consent, signed copy in the chart, time out was performed. Time out taken. The patient  was placed in the lithotomy position and the cervix brought into view with sterile speculum.  Portio of cervix cleansed x 2 with betadine swabs.  A tenaculum was placed in the anterior lip of the cervix. The uterus was sounded for depth of 9 cm,. Milex uterine Explora 3 mm was introduced to into the uterus, suction created,  and an endometrial sample was obtained. All equipment was removed and accounted for.   The patient tolerated the procedure well.    Patient given post procedure instructions.  Followup: in 2-3 days  P:  1. Schedule for surgery  2. Rx  Tramadol for pain along with NSAID   By signing my name below, I, [employer], attest that this documentation has been prepared under the direction and in the presence of Jonnie Kind, MD. Electronically Signed: [place]. 01/19/20. 2:52 PM.  I personally performed the services described in this documentation, which was SCRIBED in my presence. The recorded information has been reviewed and considered accurate. It has been edited as necessary during review. Jonnie Kind, MD

## 2020-01-19 NOTE — Addendum Note (Signed)
Addended by: Armond Hang on: 01/19/2020 03:40 PM   Modules accepted: Orders

## 2020-01-21 NOTE — Progress Notes (Signed)
Benign biopsy . Will proceed toward hysterectomy, ABDOMINAL SUPRACERVICAL HYSTERECTOMY , BILATERAL SALPINGECTOMY.

## 2020-01-25 LAB — CERVICOVAGINAL ANCILLARY ONLY
Chlamydia: NEGATIVE
Comment: NEGATIVE
Comment: NORMAL
Neisseria Gonorrhea: NEGATIVE

## 2020-01-30 NOTE — Progress Notes (Signed)
Normal pap, negative GC and Chlamydia on preop testing.

## 2020-02-02 NOTE — Patient Instructions (Signed)
Instrucciones Para Antes de la Ciruga   Su ciruga est programada para-02/07/2020 @ [place] favor llame al [phone] si tiene algn problema en la maana de la ciruga.                   Recuerde:   No coma alimentos ni tome lquidos, incluyendo agua, despus de la medianoche del 7/19/201 at midnight.    [employer] medicinas en la maana de la ciruga con un SORBITO de agua  Tramadol(if needed).   Puede cepillarse los dientes en la maana de la [place].    No use joyas, maquillaje de ojos, lpiz labial, crema para el cuerpo o esmalte de uas oscuro.    No puede usar desodorante.    Si va a ser ingresado despues de la ciruga, deje la [employer] en el carro hasta que se le haya asignado una habitacin.    A los pacientes que se les d de alta el mismo da no se les permitir manejar a casa.     Use ropa suelta y cmoda de regreso a casa. (wear loose comfortable clothes for ride home)     Histerectoma supracervical, cuidados posteriores Supracervical Hysterectomy, Care After Le[place] esta informacin sobre cmo cuidarse despus del procedimiento. Su mdico tambin podr darle indicaciones ms especficas. Comunquese con su mdico si tiene problemas o preguntas. Qu puedo esperar despus del procedimiento? Despus del procedimiento, es habitual tener algunas molestias, sensibilidad al tacto, hinchazn y hematomas en la zona de la ciruga. Esto por lo general dura unas 2 semanas. Siga estas indicaciones en su casa: Medicamentos  [employer] de venta libre y los recetados solamente como se lo haya indicado el mdico.  No tome aspirina. Puede ocasionar hemorragias. Pregntele al mdico cundo es seguro volver a tomar aspirina nuevamente.  No conduzca ni use maquinaria pesada mientras toma analgsicos recetados.  A fin de prevenir o tratar el estreimiento mientras toma analgsicos  recetados, el mdico puede recomendarle lo siguiente: ? Beber suficiente lquido para mantener la orina de color amarillo plido. ? Tomar medicamentos recetados o de [employer]. ? Consumir alimentos ricos en fibra, como frutas y verduras frescas, cereales integrales y frijoles. ? Limitar el consumo de alimentos ricos en grasas y azcares procesados, como alimentos fritos o dulces. Actividad  Descanse y duerma lo suficiente.  Trate de que alguien la acompae en su casa durante 1o 2semanas para ayudarla con los [employer].  Retome sus actividades normales como se lo haya indicado el mdico. Pregntele al mdico qu actividades son seguras para usted.  No levante ningn objeto que pese ms de 10libras (4,5kg) o el lmite de peso que le indique su mdico hasta que l le diga que puede [place].  No se haga duchas vaginales, no use tampones ni tenga relaciones sexuales durante un mnimo de 6semanas o hasta que el mdico la autorice. Cuidados de la incisin   Siga las indicaciones del mdico acerca del cuidado de las incisiones. Haga lo siguiente: ? Lvese las manos con agua y jabn antes de Quarry manager las vendas (vendajes). Use desinfectante para manos si no dispone de [place] y [place]. ?  Cambie los vendajes como se lo haya indicado el mdico. ? No retire los puntos (suturas), la goma para cerrar la piel o las tiras [place]. Es posible que estos cierres cutneos Animal nutritionist en la piel durante 2semanas o ms tiempo. Si los bordes de las tiras adhesivas empiezan a despegarse y Therapist, sports, puede recortar los que estn sueltos. No retire las tiras [facility] por completo, a menos que el mdico se lo indique.  [place] zona de la incisin para detectar signos de infeccin. Est atento a los siguientes signos: ? Dolor, hinchazn o enrojecimiento. ? Lquido o sangre. ? Calor. ? Pus o mal olor.  [place] y no baos durante 2 o 3 semanas o segn las  indicaciones de su mdico. Comida y bebida  Beba suficiente lquido para Theatre manager la orina clara o de color amarillo plido.  No beba alcohol hasta que el mdico se lo autorice. Instrucciones generales  Contrlese la temperatura durante el tiempo que le indique el mdico.  Concurra a todas las visitas de control como se lo haya indicado el mdico. Esto es importante. Comunquese con un mdico si:  Nota enrojecimiento o hinchazn, o siente dolor alrededor de una incisin.  Tiene escalofros o fiebre.  Le sale lquido o sangre de una incisin.  La incisin est caliente al tacto.  Tiene pus o percibe que sale mal olor del lugar de la incisin.  Las incisiones se abren.  Se siente mareado o sufre un desmayo.  Siente dolor o tiene sangre al [employer].  Tiene diarrea que no se resuelve.  Tiene nuseas y vmitos que no desaparecen.  Tiene secrecin vaginal anormal.  Tiene una erupcin cutnea.  Tiene dolor que no desaparece despus de Teacher, adult education. Solicite ayuda de inmediato si:  Jaclynn Guarneri, y los sntomas empeoran repentinamente.  Siente un dolor abdominal intenso.  Siente dolor en el pecho.  Le falta el aire.  Se desmaya.  Siente dolor, u observa hinchazn o enrojecimiento en la pierna.  Tiene una hemorragia vaginal abundante, con cogulos sanguneos. Resumen  Despus del procedimiento, es habitual tener algunas molestias, sensibilidad al tacto, hinchazn y hematomas en el lugar de la ciruga. Esto por lo general dura unas 2 semanas.  Solicite ayuda de inmediato si tiene hemorragia vaginal excesiva o dolor abdominal intenso. Esta informacin no tiene Marine scientist el consejo del mdico. Asegrese de hacerle al mdico cualquier pregunta que tenga. Document Revised: 02/09/2017 Document Reviewed: 02/09/2017 Elsevier Patient Education  [place], cuidados posteriores Salpingectomy, Care After [employer] brinda informacin  sobre cmo cuidarse despus del procedimiento. Su mdico tambin podr darle instrucciones ms especficas. Comunquese con el mdico si tiene problemas o preguntas. Qu puedo esperar despus del procedimiento? Despus del procedimiento, es comn tener los siguientes sntomas:  Dolor en el abdomen.  Algo de sangrado vaginal leve (prdidas) durante unos [employer].  Cansancio. El tiempo de recuperacin variar segn el mtodo que el cirujano haya utilizado para la [place]. Siga estas instrucciones en su casa: Cuidado de la incisin   Siga las instrucciones del mdico acerca del [place] incisiones. Asegrese de hacer lo siguiente: ? Lvese las manos con agua y [place] antes y despus de cambiar la venda (vendaje). Use desinfectante para manos si no dispone de [place] y [place]. ? Cambie o retire Forensic scientist se lo haya indicado el mdico. ? No retire los puntos (suturas), la goma para cerrar la piel o las tiras [place]. Es  posible que estos cierres cutneos deban quedar puestos en la piel durante 2semanas o ms tiempo. Si los bordes de las tiras adhesivas empiezan a despegarse y Therapist, sports, puede recortar los que estn sueltos. No retire las tiras [facility] por completo a menos que el mdico se lo indique.  Mantenga el vendaje limpio y [place] todos los das la zona de la incisin para detectar signos de infeccin. Est atento a los siguientes signos: ? Enrojecimiento, hinchazn o dolor que empeoran. ? Lquido o sangre. ? Calor. ? Pus o mal olor. Actividad  Haga reposo como se lo haya indicado el mdico.  Evite estar sentado durante largos perodos sin moverse. Levntese y camine un poco cada 1 a 2 horas. Esto es importante para mejorar el flujo sanguneo y la respiracin. Pida ayuda si se siente dbil o inestable.  Retome sus actividades normales segn lo indicado por el mdico. Pregntele al mdico qu actividades son seguras para usted.  No conduzca hasta que el  mdico diga que es [place].  No levante ningn objeto que pese ms de 10libras (4.5kg) o que supere el lmite de peso que le hayan indicado, hasta que el mdico le diga que puede [place]. Esto puede ser de 2 a 6 semanas, segn la [place].  Hasta que el mdico la autorice: ? No se haga duchas vaginales. ? No use tampones. ? No tenga relaciones sexuales. Medicamentos  [employer] de venta libre y los recetados solamente como se lo haya indicado el mdico.  Pregntele al mdico si el medicamento recetado: ? Hace que sea necesario que evite conducir o usar maquinaria pesada. ? Puede causarle estreimiento. Es posible que deba tomar medidas para prevenir o tratar el estreimiento, por ejemplo:  Beber suficiente lquido como para Theatre manager la orina de color amarillo plido.  Tomar medicamentos recetados o de [employer].  Consumir alimentos ricos en fibra, como frijoles, cereales integrales, y frutas y verduras frescas.  Limitar el consumo de alimentos ricos en grasa y azcares procesados, como los alimentos fritos o dulces. Instrucciones generales  Use medias de compresin como se lo haya indicado su mdico. Estas medias ayudan a evitar la formacin de cogulos de sangre y a reducir la hinchazn de las piernas.  No consuma ningn producto que contenga nicotina o tabaco, como cigarrillos, cigarrillos electrnicos y tabaco de Higher education careers adviser. Si necesita ayuda para dejar de fumar, consulte al mdico.  No tome baos de inmersin, no nade ni use el jacuzzi hasta que el mdico lo autorice. Puede ducharse.  Concurra a todas las visitas de seguimiento como se lo haya indicado el mdico. Esto es importante. Comunquese con un mdico si tiene:  Dolor al [employer].  Enrojecimiento, hinchazn o ms dolor alrededor de una incisin.  Sangre o lquido provenientes de una incisin.  Pus o mal olor provenientes de una incisin.  Una incisin que est caliente al tacto.  Cristy Hilts.  Dolor que  empeora o que no mejora con los medicamentos.  Una incisin que comienza a abrirse.  Erupcin cutnea.  Desvanecimiento.  Nuseas y vmitos. Solicite ayuda inmediatamente si:  Printmaker pecho o la pierna.  Comienza a IT sales professional.  Se desmaya.  Tiene sangrado vaginal abundante o mayor del que tena, lo suficiente como empapar un apsito en una hora. Resumen  Despus del procedimiento, es comn sentirse cansada, sentir algo de Social research officer, government en el abdomen y Best boy un sangrado vaginal leve durante [employer].  [place]  acerca del cuidado de las incisiones.  Retome sus actividades normales segn lo indicado por el mdico. Pregntele al mdico qu actividades son seguras para usted.  No se haga duchas vaginales, no use tampones ni tenga relaciones sexuales hasta que su mdico lo autorice.  Concurra a todas las visitas de seguimiento como se lo haya indicado el mdico. Esta informacin no tiene Marine scientist el consejo del mdico. Asegrese de hacerle al mdico cualquier pregunta que tenga. Document Revised: 07/28/2018 Document Reviewed: 07/28/2018 Elsevier Patient Education  [place] general en adultos, cuidados posteriores General Anesthesia, Adult, Care After Lea esta informacin sobre cmo cuidarse despus del procedimiento. El mdico tambin podr darle instrucciones ms especficas. Comunquese con su mdico si tiene problemas o preguntas. Qu puedo esperar despus del procedimiento? Luego del procedimiento, son comunes los siguiente efectos secundarios:  Dolor o Scientist, research (life sciences) en el lugar de la va intravenosa (i.v.).  Nuseas.  Vmitos.  Dolor de Investment banker, operational.  Dificultad para concentrarse.  Sentir fro o [employer].  Debilidad o cansancio.  Somnolencia y Programmer, applications.  Malestar y Hydrologist. Estos efectos secundarios pueden afectar partes del cuerpo que no estuvieron involucradas en la  ciruga. Siga estas indicaciones en su casa:  Durante al menos 24horas despus del procedimiento:  Pdale a un adulto responsable que permanezca con usted. Es importante que alguien cuide de usted hasta que se despierte y Cabin crew.  Descanse todo lo que sea necesario.  No haga lo siguiente: ? Participar en actividades en las que podra caerse o lastimarse. ? Conducir. ? Operar maquinarias pesadas. ? Beber alcohol. ? Tomar somnferos o medicamentos que causen somnolencia. ? Firmar documentos legales ni tomar [employer]. ? Cuidar a nios por su cuenta. Qu debe comer y beber  Siga las indicaciones del mdico respecto de las restricciones de comidas o bebidas.  Cuando [employer], comience a comer cantidades pequeas de alimentos que sean blandos y fciles de Publishing copy (livianos), como una tostada. Retome su dieta habitual de forma gradual.  Beba suficiente lquido como para mantener la orina de color amarillo plido.  Si vomita, rehidrtese tomando agua, jugo o caldo transparente. Instrucciones generales  Si tiene apnea del sueo, la [place] y ciertos medicamentos pueden aumentar el riesgo de problemas respiratorios. Siga las indicaciones del mdico respecto al uso de su dispositivo para dormir: ? Siempre que duerma, incluso durante las siestas que tome en el da. ? Mientras tome analgsicos recetados, medicamentos para dormir o medicamentos que producen somnolencia.  Reanude sus actividades normales segn lo indicado por el mdico. Pregntele al mdico qu actividades son seguras para usted.  Tome los medicamentos de venta libre y los recetados solamente como se lo haya indicado el mdico.  Si fuma, no lo haga sin supervisin.  Concurra a todas las visitas de seguimiento como se lo haya indicado el mdico. Esto es importante. Comunquese con un mdico si:  Tiene nuseas o vmitos que no mejoran con medicamentos.  No puede comer ni beber sin vomitar.  El  dolor no se alivia con medicamentos.  No puede orinar.  Tiene una erupcin cutnea.  Tiene fiebre.  Presenta enrojecimiento alrededor del lugar de la va intravenosa (i.v.) que empeora. Solicite ayuda de inmediato si:  Tiene dificultad para respirar.  Siente dolor en el pecho.  Observa sangre en la orina o heces, o vomita sangre. Resumen  Despus del procedimiento, es comn tener dolor de garganta y nuseas. Tambin es comn sentirse cansado.  Pdale a un adulto responsable  que permanezca con usted durante 24 horas despus de la anestesia general. Es importante que alguien cuide de usted hasta que se despierte y Cabin crew.  Cuando [employer], comience a comer cantidades pequeas de alimentos que sean blandos y fciles de Publishing copy (livianos), como una tostada. Retome su dieta habitual de forma gradual.  Beba suficiente lquido como para mantener la orina de color amarillo plido.  Reanude sus actividades normales segn lo indicado por el mdico. Pregntele al mdico qu actividades son seguras para usted. Esta informacin no tiene Marine scientist el consejo del mdico. Asegrese de hacerle al mdico cualquier pregunta que tenga. Document Revised: 05/04/2017 Document Reviewed: 05/04/2017 Elsevier Patient Education  [place] usar clorhexidina para baarse How to Use Chlorhexidine for Bathing El gluconato de clorhexidina (CHG) es una solucin desinfectante (antisptica) que se utiliza para limpiar la piel. Puede eliminar las bacterias que normalmente viven en la piel y mantenerlas alejadas durante aproximadamente 24 horas. Para limpiarse la piel con CHG, es posible que le den lo siguiente:  Una solucin de CHG para usar en la ducha o como parte de un bao de [place].  Un pao preenvasado que contenga CHG. Limpiar la piel con CHG puede ayudar a disminuir el riesgo de infeccin:  Mientras permanece en la unidad de cuidados intensivos del hospital.  Si tiene  un acceso vascular, como una va central, para proporcionar acceso a corto o largo plazo a las venas.  Si tiene un catter para que drene orina de la vejiga.  Si tiene Animal nutritionist. El respirador es un aparato que lo ayuda a Ambulance person al hacer que el aire entre y salga de sus pulmones.  Despus de [place]. Cules son los riesgos? Los riesgos de usar de CHG incluyen los siguientes:  Reacciones en la piel.  Prdida de la audicin si el CHG ingresa en los odos.  Lesin ocular si el CHG ingresa en los ojos y no se enjuaga.  El CHG es inflamable. Asegrese de no fumar ni acercarse al fuego luego de aplicarse CHG en la piel. No use CHG:  Si es alrgico a la clorhexidina o anteriormente tuvo una reaccin a la clorhexidina.  En bebs de menos de 2 meses. Cmo usar la solucin de CHG  Use CHG nicamente como se lo indique su mdico y siga las instrucciones de la etiqueta.  Use la cantidad de CHG que le indicaron. Usualmente, la medida es una botella. Durante una ducha Siga estos pasos al usar la solucin de CHG durante una ducha (a menos que su mdico le brinde instrucciones diferentes): 1. Comience a ducharse. 2. Lvese la cara y el cabello con el jabn y el champ que utiliza habitualmente. [place] de abajo del agua. 4. Vierta el CHG en un pao limpio. No use ningn cepillo o esponja con bordes rugosos. 5. Comience en el cuello y colquese la espuma en todo el cuerpo [employer]. Asegrese de seguir estas instrucciones: ? Si le harn una ciruga, preste especial atencin a la parte del cuerpo Research officer, political party. Frote la zona durante al menos 1 minuto. ? No use el CHG en su cabeza o cara. Si la solucin [employer] odos o los ojos, enjuague bien con [place]. ? Evite la zona genital. ? Evite las zonas de la piel que estn lastimadas o tengan cortes o raspaduras. ? Frote su espalda y [employer]. Asegrese de lavar los pliegues de la  piel. 6. Deje actuar la espuma por 1 o 2 minutos, o por el tiempo que le haya indicado el mdico. 7. Enjuguese todo el cuerpo bajo la ducha. Asegrese de enjuagar bien todos los pliegues y hendiduras de la piel. 8. Seque con una toalla limpia. Despus no aplique ninguna sustancia en el cuerpo, como polvo, locin o perfume, excepto que se lo indique el mdico. Solo use las lociones recomendadas por el fabricante. 9. Pngase pijama o ropa limpia. 10. Si es la noche anterior a la ciruga, duerma en sbanas limpias.  Durante un bao de esponja Siga estos pasos al usar la solucin de CHG durante un bao de [place] (a menos que su mdico le brinde instrucciones diferentes): 1. Lvese la cara y el cabello con el jabn y el champ que utiliza habitualmente. 2. Vierta el CHG en un pao limpio. 3. Comience en el cuello y colquese la espuma en todo el cuerpo [employer]. Asegrese de seguir estas instrucciones: ? Si le harn una ciruga, preste especial atencin a la parte del cuerpo Research officer, political party. Frote la zona durante al menos 1 minuto. ? No use el CHG en su cabeza o cara. Si la solucin [employer] odos o los ojos, enjuague bien con [place]. ? Evite la zona genital. ? Evite las zonas de la piel que estn lastimadas o tengan cortes o raspaduras. ? Frote su espalda y [employer]. Asegrese de lavar los pliegues de la piel. 4. Deje actuar la espuma por 1 o 2 minutos, o por el tiempo que le haya indicado el mdico. 5. Con un pao limpio y hmedo diferente, enjuguese bien todo el cuerpo. Asegrese de enjuagar bien todos los pliegues y hendiduras de la piel. 6. Seque con una toalla limpia. Despus no aplique ninguna sustancia en el cuerpo, como polvo, locin o perfume, excepto que se lo indique el mdico. Solo use las lociones recomendadas por el fabricante. 7. Pngase pijama o ropa limpia. 8. Si es la noche anterior a la ciruga, duerma en sbanas limpias. [employer] paos  preenvasados de CHG  Use los paos de CHG nicamente como se lo haya indicado el mdico y siga las instrucciones de la etiqueta.  Use los paos de CHG sobre la piel limpia y [place].  No use el pao de CHG en la cabeza o el rostro a menos que el mdico se lo indique.  Cuando lave con el pao de CHG: ? Evite la zona genital. ? Evite las zonas de la piel que estn lastimadas o tengan cortes o raspaduras. Antes de la ciruga Siga estos pasos al usar un pao de CHG para limpiar antes de una ciruga (a menos que su mdico le brinde instrucciones diferentes): 1. Con el pao de CHG, frtese con firmeza la parte del cuerpo donde le realizarn la ciruga. Frtese de un lado al otro durante 3 minutos. El rea del cuerpo debe estar completamente hmeda con CHG cuando termine de frotar. 2. No enjuague. Deseche el pao y deje que el rea se seque al aire. Despus no aplique ninguna sustancia sobre el rea, como polvos, lociones o perfume. 3. Pngase pijama o ropa limpia. 4. Si es la noche anterior a la ciruga, duerma en sbanas limpias.  Para baarse en general Siga estos pasos al usar el pao de CHG para baarse en general (a menos que su mdico le brinde instrucciones diferentes). 1. Use un pao de CHG diferente en cada rea del cuerpo. Asegrese  de [employer] de la piel y [place] dedos de las manos y de los pies. Lvese el cuerpo en el siguiente orden, usando un pao nuevo despus de cada paso: ? La parte delantera del cuello, los hombros y [place]. ? Ambos brazos, debajo de los brazos y [place]. ? El estmago y la zona de la ingle, evitando los genitales. ? La pierna y el pie derechos. ? La pierna y el pie izquierdos. ? La parte posterior del cuello, la espalda y las nalgas. 2. No enjuague. Deseche el pao y deje que el rea se seque al aire. Despus no aplique ninguna sustancia en el cuerpo, como polvo, locin o perfume, excepto que se lo indique el mdico. Solo use las lociones  recomendadas por el fabricante. 3. Pngase pijama o ropa limpia. Comunquese con un mdico si:  La piel se irrita despus de frotarla.  Tiene preguntas sobre cmo usar la solucin o el pao. Solicite ayuda inmediatamente si:  Los ojos se ponen muy rojos o Cytogeneticist.  Siente picazn intensa en los ojos.  Siente picazn intensa en la piel y est roja o hinchada.  Nota cambios en su audicin.  Tiene dificultad para ver.  Tiene hinchazn u hormigueo en la garganta o la boca.  Tiene dificultad para respirar.  Traga un poco de clorhexidina. Resumen  El gluconato de clorhexidina (CHG) es una solucin desinfectante (antisptica) que se utiliza para limpiar la piel. Limpiar la piel con CHG puede ayudar a disminuir el riesgo de infeccin.  Es posible que le indiquen que utilice CHG para baarse. Esta solucin puede venir en botella o en un pao preenvasado para que use sobre su piel. Siga cuidadosamente las instrucciones del mdico y las instrucciones que se encuentran en la etiqueta del producto.  No use CHG si es alrgico a la clorhexidina.  Pngase en contacto con su mdico si se le irrita la piel luego de frotar. Esta informacin no tiene Marine scientist el consejo del mdico. Asegrese de hacerle al mdico cualquier pregunta que tenga. Document Revised: 10/26/2018 Document Reviewed: 08/08/2017 Elsevier Patient Education  [place].

## 2020-02-03 ENCOUNTER — Encounter (HOSPITAL_COMMUNITY): Payer: Self-pay

## 2020-02-03 ENCOUNTER — Other Ambulatory Visit: Payer: Self-pay | Admitting: Obstetrics and Gynecology

## 2020-02-03 ENCOUNTER — Encounter (HOSPITAL_COMMUNITY)
Admission: RE | Admit: 2020-02-03 | Discharge: 2020-02-03 | Disposition: A | Discharge status: Home / Self Care | Payer: Self-pay | Source: Ambulatory Visit | Attending: Obstetrics and Gynecology | Admitting: Obstetrics and Gynecology

## 2020-02-03 ENCOUNTER — Other Ambulatory Visit: Payer: Self-pay

## 2020-02-03 ENCOUNTER — Other Ambulatory Visit (HOSPITAL_COMMUNITY)
Admission: RE | Admit: 2020-02-03 | Discharge: 2020-02-03 | Disposition: A | Discharge status: Home / Self Care | Payer: Self-pay | Source: Ambulatory Visit | Attending: Obstetrics and Gynecology | Admitting: Obstetrics and Gynecology

## 2020-02-03 DIAGNOSIS — Z20822 Contact with and (suspected) exposure to covid-19: Secondary | ICD-10-CM | POA: Insufficient documentation

## 2020-02-03 DIAGNOSIS — Z01812 Encounter for preprocedural laboratory examination: Secondary | ICD-10-CM | POA: Insufficient documentation

## 2020-02-03 LAB — CBC
HCT: 43 % (ref 36.0–46.0)
Hemoglobin: 13.5 g/dL (ref 12.0–15.0)
MCH: 24.1 pg — ABNORMAL LOW (ref 26.0–34.0)
MCHC: 31.4 g/dL (ref 30.0–36.0)
MCV: 76.6 fL — ABNORMAL LOW (ref 80.0–100.0)
Platelets: 205 10*3/uL (ref 150–400)
RBC: 5.61 MIL/uL — ABNORMAL HIGH (ref 3.87–5.11)
WBC: 6.6 10*3/uL (ref 4.0–10.5)
nRBC: 0 % (ref 0.0–0.2)

## 2020-02-03 LAB — TYPE AND SCREEN
ABO/RH(D): B POS
Antibody Screen: NEGATIVE

## 2020-02-03 LAB — COMPREHENSIVE METABOLIC PANEL
ALT: 29 U/L (ref 0–44)
AST: 25 U/L (ref 15–41)
Albumin: 4.1 g/dL (ref 3.5–5.0)
Alkaline Phosphatase: 56 U/L (ref 38–126)
Anion gap: 13 (ref 5–15)
BUN: 9 mg/dL (ref 6–20)
CO2: 18 mmol/L — ABNORMAL LOW (ref 22–32)
Calcium: 8.7 mg/dL — ABNORMAL LOW (ref 8.9–10.3)
Chloride: 104 mmol/L (ref 98–111)
Creatinine, Ser: 0.51 mg/dL (ref 0.44–1.00)
GFR calc Af Amer: >60 mL/min (ref >60)
GFR calc non Af Amer: >60 mL/min (ref >60)
Glucose, Bld: 259 mg/dL — ABNORMAL HIGH (ref 70–99)
Potassium: 3.2 mmol/L — ABNORMAL LOW (ref 3.5–5.1)
Sodium: 135 mmol/L (ref 135–145)
Total Bilirubin: 0.6 mg/dL (ref 0.3–1.2)
Total Protein: 7.6 g/dL (ref 6.5–8.1)

## 2020-02-03 LAB — HEMOGLOBIN A1C
Hgb A1c MFr Bld: 7.3 % — ABNORMAL HIGH (ref 4.8–5.6)
Mean Plasma Glucose: 162.81 mg/dL

## 2020-02-03 LAB — HCG, SERUM, QUALITATIVE: Preg, Serum: NEGATIVE

## 2020-02-04 LAB — SARS CORONAVIRUS 2 (TAT 6-24 HRS): SARS Coronavirus 2: NEGATIVE

## 2020-02-06 ENCOUNTER — Other Ambulatory Visit: Payer: Self-pay | Admitting: Obstetrics and Gynecology

## 2020-02-06 ENCOUNTER — Encounter (HOSPITAL_COMMUNITY): Payer: Self-pay | Admitting: Anesthesiology

## 2020-02-06 ENCOUNTER — Telehealth: Payer: Self-pay | Admitting: Obstetrics and Gynecology

## 2020-02-06 MED ORDER — POTASSIUM CHLORIDE CRYS ER 20 MEQ PO TBCR
40.0000 meq | EXTENDED_RELEASE_TABLET | Freq: Every day | ORAL | 1 refills | Status: DC
Start: 2020-02-06 — End: 2020-03-04

## 2020-02-06 NOTE — Telephone Encounter (Signed)
Phone call to normal plan to the patient informing her that her potassium was low and that she needs to take 2 tablets today.  She asked about her Metformin.  She will take 1 single Metformin this morning.  We will find a different time hypoglycemic agent for after she has her surgery as she does not like the GI side effects of the Metformin

## 2020-02-06 NOTE — Progress Notes (Signed)
Patient is for Hysterectomy in am.  Potassium 3.2 - Prescription called in to her pharmacy per Dr. Glo Herring.  CBG 259 - We will recheck on arrival per Dr Consuella Lose Anesthesiologist.

## 2020-02-06 NOTE — Progress Notes (Signed)
Begun on K dur for mild hypokalemia preop.

## 2020-02-07 ENCOUNTER — Other Ambulatory Visit (HOSPITAL_COMMUNITY)
Admission: RE | Admit: 2020-02-07 | Discharge: 2020-02-07 | Disposition: A | Discharge status: Home / Self Care | Payer: Self-pay | Source: Ambulatory Visit | Attending: Obstetrics and Gynecology | Admitting: Obstetrics and Gynecology

## 2020-02-07 ENCOUNTER — Inpatient Hospital Stay (HOSPITAL_COMMUNITY): Admission: RE | Admit: 2020-02-07 | Payer: Self-pay | Source: Home / Self Care | Admitting: Obstetrics and Gynecology

## 2020-02-07 ENCOUNTER — Encounter (HOSPITAL_COMMUNITY): Admission: RE | Payer: Self-pay | Source: Home / Self Care

## 2020-02-07 ENCOUNTER — Telehealth: Payer: Self-pay | Admitting: Obstetrics and Gynecology

## 2020-02-07 ENCOUNTER — Encounter: Payer: Self-pay | Admitting: Obstetrics and Gynecology

## 2020-02-07 ENCOUNTER — Ambulatory Visit (INDEPENDENT_AMBULATORY_CARE_PROVIDER_SITE_OTHER): Payer: Self-pay | Admitting: Obstetrics and Gynecology

## 2020-02-07 VITALS — BP 113/74 | HR 107 | Ht <= 58 in | Wt 116.8 lb

## 2020-02-07 DIAGNOSIS — Z124 Encounter for screening for malignant neoplasm of cervix: Secondary | ICD-10-CM

## 2020-02-07 DIAGNOSIS — Z01419 Encounter for gynecological examination (general) (routine) without abnormal findings: Secondary | ICD-10-CM

## 2020-02-07 SURGERY — HYSTERECTOMY, SUPRACERVICAL, ABDOMINAL
Anesthesia: General

## 2020-02-07 NOTE — Hospital Course (Signed)
Surgery postponed.

## 2020-02-07 NOTE — Progress Notes (Signed)
Patient ID: Rhonda Hawkins, female   DOB: 06/04/1976, 44 y.o.   MRN: [id number]   Assessment:  Annual Gyn Exam Plan:  1. Pap smear done, next pap due 5 years 2.   Annual mammogram advised after age 37 3.   Will proceed with abdominal supracervical hysterectomy once pap results reviewed and discussed with the patient, and her hypokalemia and suboptimal glucose control corrected. Subjective:  Rhonda Hawkins is a 44 y.o. female G5P5 who presents for annual exam. Patient's last menstrual period was 02/02/2020. The patient has no complaints today. She has five children at home and has had one c section.  The following portions of the patient's history were reviewed and updated as appropriate: allergies, current medications, past family history, past medical history, past social history, past surgical history and problem list. Past Medical History:  Diagnosis Date  . Diabetes mellitus without complication [facility])     Past Surgical History:  Procedure Laterality Date  . DILATION AND CURETTAGE OF UTERUS    . LAPAROTOMY  02/07/2011   Procedure: LAPAROTOMY;  Surgeon: Jonnie Kind, MD;  Location: [facility];  Service: Gynecology;  Laterality: N/A;  . TUBAL LIGATION  02/07/2011   Procedure: BILATERAL TUBAL LIGATION;  Surgeon: Jonnie Kind, MD;  Location: [facility];  Service: Gynecology;  Laterality: Bilateral;  Bilateral salpingectomy     Current Outpatient Medications:  .  ergocalciferol (VITAMIN D2) 1.25 MG (50000 UT) capsule, Take 50,000 Units by mouth daily. , Disp: , Rfl:  .  fenofibrate (TRICOR) 145 MG tablet, Take 145 mg by mouth daily., Disp: , Rfl:  .  ferrous sulfate 324 MG TBEC, Take 324 mg by mouth., Disp: , Rfl:  .  glipiZIDE (GLUCOTROL) 10 MG tablet, Take 10 mg by mouth 2 (two) times daily before a meal., Disp: , Rfl:  .  Pediatric Multivit-Minerals-C (RA GUMMY VITAMINS & MINERALS PO), Take 2 tablets by mouth daily. , Disp: , Rfl:  .  potassium chloride SA  (KLOR-CON) 20 MEQ tablet, Take 2 tablets (40 mEq total) by mouth daily., Disp: 30 tablet, Rfl: 1 .  traMADol (ULTRAM) 50 MG tablet, Take 1 tablet (50 mg total) by mouth every 6 (six) hours as needed for moderate pain or severe pain., Disp: 30 tablet, Rfl: 0  Review of Systems Constitutional: negative Gastrointestinal: negative Genitourinary: negative  Objective:  BP 113/74 (BP Location: Right Arm, Patient Position: Sitting, Cuff Size: Normal)   Pulse (!) 107   Ht 4\' 10"  (1.473 m)   Wt 116 lb 12.8 oz (53 kg)   LMP 02/02/2020   BMI 24.41 kg/m    BMI: Body mass index is 24.41 kg/m.  General Appearance: Alert, appropriate appearance for age. No acute distress HEENT: Grossly normal Neck / Thyroid:  Cardiovascular: RRR; normal S1, S2, no murmur Lungs: CTA bilaterally Back: No CVAT Breast Exam: deferred Gastrointestinal: Soft, non-tender, no masses or organomegaly Pelvic Exam: Uterine fibroids Rectovaginal: not done Lymphatic Exam: Non-palpable nodes in neck, clavicular, axillary, or inguinal regions  Skin: no rash or abnormalities Neurologic: Normal gait and speech, no tremor  Psychiatric: Alert and oriented, appropriate affect.  Urinalysis:Not done  Mallory Shirk. MD Pgr [phone] 2:50 PM  By signing my name below, I, De Burrs, attest that this documentation has been prepared under the direction and in the presence of Jonnie Kind, MD. Electronically Signed: De Burrs, Medical Scribe. 02/07/20. 2:50 PM.  I personally performed the services described in this documentation, which was SCRIBED in my presence.  The recorded information has been reviewed and considered accurate. It has been edited as necessary during review. Jonnie Kind, MD

## 2020-02-07 NOTE — Telephone Encounter (Signed)
Patient had additional questions for provider upon checking out for today's appointment and would like provider to give her a call at earliest convenience.

## 2020-02-07 NOTE — Progress Notes (Signed)
IN reviewing her pre-op labs, I incorrectly interpreted her as having had a pap/HPV at the time of the GC/ CHL on 7/1.  A full review of the referral labs from the health Dept does not include a pap since 2009.   Pt seen this a.m. with her daughter as Optometrist. The patient does Not recall [facility] a [facility] at [facility].  Will have pt come to office this PM for collection of Pap/HPV, and once confirmed as normal the patient will be rescheduled. In the interim, the patient has been seen at the [facility] Dept and switched from Metformin to Glypizide 10 mg daily, and does not have the diarrhea that was a side effect of the Metformin.  Pt has picked up her Potassium and is to take it daily til the surgery.  Pt understands the changes in plans and will be seen at 13:30 in office.

## 2020-02-07 NOTE — Telephone Encounter (Signed)
Needs a work note for today. Will return to work tomorrow. Note printed and pt's daughter picked up note at front desk. [place]

## 2020-02-13 LAB — CYTOLOGY - PAP
Comment: NEGATIVE
Diagnosis: UNDETERMINED — AB
High risk HPV: NEGATIVE

## 2020-02-15 ENCOUNTER — Encounter: Payer: Self-pay | Admitting: Obstetrics and Gynecology

## 2020-02-17 NOTE — Progress Notes (Signed)
HPV negative, ASCUS pap. So will be able to repeat pap in 3 yrs if cervix left in place at hysterectomy

## 2020-02-22 NOTE — Patient Instructions (Addendum)
Instrucciones Para Antes de la Ciruga   Su ciruga est programada para-(your procedure is scheduled on) 02/28/2020 @ 0800.   [place] - (enter)    Por favor llame al [phone] si tiene algn problema en la maana de la ciruga. (please call if you have any problems the morning of surgery.)                  Recuerde: (Remember)   No coma alimentos ni tome lquidos, incluyendo agua, despus de la medianoche del  (Do not eat food or drink liquids including water after midnight on 02/27/2020.   Tome estas medicinas en la maana de la ciruga con un SORBITO de agua (take these meds the morning of surgery with a SIP of water) ultram (if needed).   Puede cepillarse los dientes en la maana de la [place]. (you may brush your teeth the morning of surgery)   No use joyas, maquillaje de ojos, lpiz labial, crema para el cuerpo o esmalte de uas oscuro. (Do not wear jewelry, eye makeup, lipstick, body lotion, or dark fingernail polish)   No puede usar desodorante. (you may wear deodorant)   Si va a ser ingresado despues de la ciruga, deje la maleta en el carro hasta que se le haya asignado una habitacin. (If you are to be admitted after surgery, leave suitcase in car until your room has been assigned.)   A los pacientes que se les d de alta el mismo da no se les permitir manejar a casa.  (Patients discharged on the day of surgery will not be allowed to drive home)   Use ropa suelta y cmoda de regreso a casa. (wear loose comfortable clothes for ride home)    [place] (patient signature)  Salpingectoma, cuidados posteriores Salpingectomy, Care After Esta hoja le brinda informacin sobre cmo cuidarse despus del procedimiento. Su mdico tambin podr darle instrucciones ms especficas. Comunquese con el mdico si tiene problemas o preguntas. Qu puedo esperar despus del procedimiento? Despus del  procedimiento, es comn tener los siguientes sntomas:  Dolor en el abdomen.  Algo de sangrado vaginal leve (prdidas) durante unos [employer].  Cansancio. El tiempo de recuperacin variar segn el mtodo que el cirujano haya utilizado para la [place]. Siga estas instrucciones en su casa: Cuidado de la incisin   Siga las instrucciones del mdico acerca del [place] incisiones. Asegrese de hacer lo siguiente: ? Lvese las manos con agua y [place] antes y despus de cambiar la venda (vendaje). Use desinfectante para manos si no dispone de [place] y [place]. ? Cambie o retire Forensic scientist se lo haya indicado el mdico. ? No retire los puntos (suturas), la goma para cerrar la piel o las tiras [place]. Es posible que estos cierres cutneos deban quedar puestos en la piel durante 2semanas o ms tiempo. Si los bordes de las tiras adhesivas empiezan a despegarse y Therapist, sports, puede recortar los que estn sueltos. No retire las tiras [facility] por completo a menos que el mdico se lo indique.  Mantenga el vendaje limpio y [place] todos los das la zona de la incisin para detectar signos de infeccin. Est atento a los siguientes signos: ?  Enrojecimiento, hinchazn o dolor que empeoran. ? Lquido o sangre. ? Calor. ? Pus o mal olor. Actividad  Haga reposo como se lo haya indicado el mdico.  Evite estar sentado durante largos perodos sin moverse. Levntese y camine un poco cada 1 a 2 horas. Esto es importante para mejorar el flujo sanguneo y la respiracin. Pida ayuda si se siente dbil o inestable.  Retome sus actividades normales segn lo indicado por el mdico. Pregntele al mdico qu actividades son seguras para usted.  No conduzca hasta que el mdico diga que es [place].  No levante ningn objeto que pese ms de 10libras (4.5kg) o que supere el lmite de peso que le hayan indicado, hasta que el mdico le diga que puede [place]. Esto puede ser de 2 a 6 semanas, segn  la [place].  Hasta que el mdico la autorice: ? No se haga duchas vaginales. ? No use tampones. ? No tenga relaciones sexuales. Medicamentos  [employer] de venta libre y los recetados solamente como se lo haya indicado el mdico.  Pregntele al mdico si el medicamento recetado: ? Hace que sea necesario que evite conducir o usar maquinaria pesada. ? Puede causarle estreimiento. Es posible que deba tomar medidas para prevenir o tratar el estreimiento, por ejemplo:  Beber suficiente lquido como para Theatre manager la orina de color amarillo plido.  Tomar medicamentos recetados o de [employer].  Consumir alimentos ricos en fibra, como frijoles, cereales integrales, y frutas y verduras frescas.  Limitar el consumo de alimentos ricos en grasa y azcares procesados, como los alimentos fritos o dulces. Instrucciones generales  Use medias de compresin como se lo haya indicado su mdico. Estas medias ayudan a evitar la formacin de cogulos de sangre y a reducir la hinchazn de las piernas.  No consuma ningn producto que contenga nicotina o tabaco, como cigarrillos, cigarrillos electrnicos y tabaco de Higher education careers adviser. Si necesita ayuda para dejar de fumar, consulte al mdico.  No tome baos de inmersin, no nade ni use el jacuzzi hasta que el mdico lo autorice. Puede ducharse.  Concurra a todas las visitas de seguimiento como se lo haya indicado el mdico. Esto es importante. Comunquese con un mdico si tiene:  Dolor al [employer].  Enrojecimiento, hinchazn o ms dolor alrededor de una incisin.  Sangre o lquido provenientes de una incisin.  Pus o mal olor provenientes de una incisin.  Una incisin que est caliente al tacto.  Cristy Hilts.  Dolor que empeora o que no mejora con los medicamentos.  Una incisin que comienza a abrirse.  Erupcin cutnea.  Desvanecimiento.  Nuseas y vmitos. Solicite ayuda inmediatamente si:  Printmaker pecho o la  pierna.  Comienza a IT sales professional.  Se desmaya.  Tiene sangrado vaginal abundante o mayor del que tena, lo suficiente como empapar un apsito en una hora. Resumen  Despus del procedimiento, es comn sentirse cansada, sentir algo de Social research officer, government en el abdomen y Best boy un sangrado vaginal leve durante [employer].  Siga las instrucciones del mdico acerca del cuidado de las incisiones.  Retome sus actividades normales segn lo indicado por el mdico. Pregntele al mdico qu actividades son seguras para usted.  No se haga duchas vaginales, no use tampones ni tenga relaciones sexuales hasta que su mdico lo autorice.  Concurra a todas las visitas de seguimiento como se lo haya indicado el mdico. Esta informacin no tiene Marine scientist el consejo del mdico. Asegrese de hacerle al mdico cualquier pregunta  que tenga. Document Revised: 07/28/2018 Document Reviewed: 07/28/2018 Elsevier Patient Education  2020 [place] supracervical, cuidados posteriores Supracervical Hysterectomy, Care After [place] esta informacin sobre cmo cuidarse despus del procedimiento. Su mdico tambin podr darle indicaciones ms especficas. Comunquese con su mdico si tiene problemas o preguntas. Qu puedo esperar despus del procedimiento? Despus del procedimiento, es habitual tener algunas molestias, sensibilidad al tacto, hinchazn y hematomas en la zona de la ciruga. Esto por lo general dura unas 2 semanas. Siga estas indicaciones en su casa: Medicamentos  [employer] de venta libre y los recetados solamente como se lo haya indicado el mdico.  No tome aspirina. Puede ocasionar hemorragias. Pregntele al mdico cundo es seguro volver a tomar aspirina nuevamente.  No conduzca ni use maquinaria pesada mientras toma analgsicos recetados.  A fin de prevenir o tratar el estreimiento mientras toma analgsicos recetados, el mdico puede recomendarle lo  siguiente: ? Beber suficiente lquido para mantener la orina de color amarillo plido. ? Tomar medicamentos recetados o de [employer]. ? Consumir alimentos ricos en fibra, como frutas y verduras frescas, cereales integrales y frijoles. ? Limitar el consumo de alimentos ricos en grasas y azcares procesados, como alimentos fritos o dulces. Actividad  Descanse y duerma lo suficiente.  Trate de que alguien la acompae en su casa durante 1o 2semanas para ayudarla con los [employer].  Retome sus actividades normales como se lo haya indicado el mdico. Pregntele al mdico qu actividades son seguras para usted.  No levante ningn objeto que pese ms de 10libras (4,5kg) o el lmite de peso que le indique su mdico hasta que l le diga que puede [place].  No se haga duchas vaginales, no use tampones ni tenga relaciones sexuales durante un mnimo de 6semanas o hasta que el mdico la autorice. Cuidados de la incisin   Siga las indicaciones del mdico acerca del cuidado de las incisiones. Haga lo siguiente: ? Lvese las manos con agua y jabn antes de [place]ar[place] manager las vendas (vendajes). Use desinfectante para manos si no dispone de [place] y [place]. ? Cambie los vendajes como se lo haya indicado el mdico. ? No retire los puntos (suturas), la goma para cerrar la piel o las tiras [place]. Es posible que estos cierres cutneos Animal nutritionist en la piel durante 2semanas o ms tiempo. Si los bordes de las tiras adhesivas empiezan a despegarse y Therapist, sports, puede recortar los que estn sueltos. No retire las tiras [facility] por completo, a menos que el mdico se lo indique.  [place] zona de la incisin para detectar signos de infeccin. Est atento a los siguientes signos: ? Dolor, hinchazn o enrojecimiento. ? Lquido o sangre. ? Calor. ? Pus o mal olor.  Troutman y no baos durante 2 o 3 semanas o segn las indicaciones de su mdico. Comida y bebida  Beba  suficiente lquido para Theatre manager la orina clara o de color amarillo plido.  No beba alcohol hasta que el mdico se lo autorice. Instrucciones generales  Contrlese la temperatura durante el tiempo que le indique el mdico.  Concurra a todas las visitas de control como se lo haya indicado el mdico. Esto es importante. Comunquese con un mdico si:  Nota enrojecimiento o hinchazn, o siente dolor alrededor de una incisin.  Tiene escalofros o fiebre.  Le sale lquido o sangre de una incisin.  La incisin est caliente al tacto.  Tiene pus o percibe que sale mal olor del lugar de la  incisin.  Las incisiones se abren.  Se siente mareado o sufre un desmayo.  Siente dolor o tiene sangre al [employer].  Tiene diarrea que no se resuelve.  Tiene nuseas y vmitos que no desaparecen.  Tiene secrecin vaginal anormal.  Tiene una erupcin cutnea.  Tiene dolor que no desaparece despus de Teacher, adult education. Solicite ayuda de inmediato si:  Jaclynn Guarneri, y los sntomas empeoran repentinamente.  Siente un dolor abdominal intenso.  Siente dolor en el pecho.  Le falta el aire.  Se desmaya.  Siente dolor, u observa hinchazn o enrojecimiento en la pierna.  Tiene una hemorragia vaginal abundante, con cogulos sanguneos. Resumen  Despus del procedimiento, es habitual tener algunas molestias, sensibilidad al tacto, hinchazn y hematomas en el lugar de la ciruga. Esto por lo general dura unas 2 semanas.  Solicite ayuda de inmediato si tiene hemorragia vaginal excesiva o dolor abdominal intenso. Esta informacin no tiene Marine scientist el consejo del mdico. Asegrese de hacerle al mdico cualquier pregunta que tenga. Document Revised: 02/09/2017 Document Reviewed: 02/09/2017 Elsevier Patient Education  [place] general en adultos, cuidados posteriores General Anesthesia, Adult, Care After [place] esta informacin sobre cmo cuidarse despus del  procedimiento. El mdico tambin podr darle instrucciones ms especficas. Comunquese con su mdico si tiene problemas o preguntas. Qu puedo esperar despus del procedimiento? Luego del procedimiento, son comunes los siguiente efectos secundarios:  Dolor o Scientist, research (life sciences) en el lugar de la va intravenosa (i.v.).  Nuseas.  Vmitos.  Dolor de Investment banker, operational.  Dificultad para concentrarse.  Sentir fro o [employer].  Debilidad o cansancio.  Somnolencia y Programmer, applications.  Malestar y Hydrologist. Estos efectos secundarios pueden afectar partes del cuerpo que no estuvieron involucradas en la ciruga. Siga estas indicaciones en su casa:  Durante al menos 24horas despus del procedimiento:  Pdale a un adulto responsable que permanezca con usted. Es importante que alguien cuide de usted hasta que se despierte y Cabin crew.  Descanse todo lo que sea necesario.  No haga lo siguiente: ? Participar en actividades en las que podra caerse o lastimarse. ? Conducir. ? Operar maquinarias pesadas. ? Beber alcohol. ? Tomar somnferos o medicamentos que causen somnolencia. ? Firmar documentos legales ni tomar [employer]. ? Cuidar a nios por su cuenta. Qu debe comer y beber  Siga las indicaciones del mdico respecto de las restricciones de comidas o bebidas.  Cuando [employer], comience a comer cantidades pequeas de alimentos que sean blandos y fciles de Publishing copy (livianos), como una tostada. Retome su dieta habitual de forma gradual.  Beba suficiente lquido como para mantener la orina de color amarillo plido.  Si vomita, rehidrtese tomando agua, jugo o caldo transparente. Instrucciones generales  Si tiene apnea del sueo, la [place] y ciertos medicamentos pueden aumentar el riesgo de problemas respiratorios. Siga las indicaciones del mdico respecto al uso de su dispositivo para dormir: ? Siempre que duerma, incluso durante las siestas que tome en el da. ? Mientras  tome analgsicos recetados, medicamentos para dormir o medicamentos que producen somnolencia.  Reanude sus actividades normales segn lo indicado por el mdico. Pregntele al mdico qu actividades son seguras para usted.  Tome los medicamentos de venta libre y los recetados solamente como se lo haya indicado el mdico.  Si fuma, no lo haga sin supervisin.  Concurra a todas las visitas de seguimiento como se lo haya indicado el mdico. Esto es importante. Comunquese con un mdico si:  Tiene nuseas o vmitos que no mejoran  con medicamentos.  No puede comer ni beber sin vomitar.  El dolor no se alivia con medicamentos.  No puede orinar.  Tiene una erupcin cutnea.  Tiene fiebre.  Presenta enrojecimiento alrededor del lugar de la va intravenosa (i.v.) que empeora. Solicite ayuda de inmediato si:  Tiene dificultad para respirar.  Siente dolor en el pecho.  Observa sangre en la orina o heces, o vomita sangre. Resumen  Despus del procedimiento, es comn tener dolor de garganta y nuseas. Tambin es comn sentirse cansado.  Pdale a un adulto responsable que permanezca con usted durante 24 horas despus de la anestesia general. Es importante que alguien cuide de usted hasta que se despierte y Cabin crew.  Cuando [employer], comience a comer cantidades pequeas de alimentos que sean blandos y fciles de Publishing copy (livianos), como una tostada. Retome su dieta habitual de forma gradual.  Beba suficiente lquido como para mantener la orina de color amarillo plido.  Reanude sus actividades normales segn lo indicado por el mdico. Pregntele al mdico qu actividades son seguras para usted. Esta informacin no tiene Marine scientist el consejo del mdico. Asegrese de hacerle al mdico cualquier pregunta que tenga. Document Revised: 05/04/2017 Document Reviewed: 05/04/2017 Elsevier Patient Education  [place] usar clorhexidina para baarse How to Use  Chlorhexidine for Bathing El gluconato de clorhexidina (CHG) es una solucin desinfectante (antisptica) que se utiliza para limpiar la piel. Puede eliminar las bacterias que normalmente viven en la piel y mantenerlas alejadas durante aproximadamente 24 horas. Para limpiarse la piel con CHG, es posible que le den lo siguiente:  Una solucin de CHG para usar en la ducha o como parte de un bao de [place].  Un pao preenvasado que contenga CHG. Limpiar la piel con CHG puede ayudar a disminuir el riesgo de infeccin:  Mientras permanece en la unidad de cuidados intensivos del hospital.  Si tiene un acceso vascular, como una va central, para proporcionar acceso a corto o largo plazo a las venas.  Si tiene un catter para que drene orina de la vejiga.  Si tiene Animal nutritionist. El respirador es un aparato que lo ayuda a Ambulance person al hacer que el aire entre y salga de sus pulmones.  Despus de [place]. Cules son los riesgos? Los riesgos de usar de CHG incluyen los siguientes:  Reacciones en la piel.  Prdida de la audicin si el CHG ingresa en los odos.  Lesin ocular si el CHG ingresa en los ojos y no se enjuaga.  El CHG es inflamable. Asegrese de no fumar ni acercarse al fuego luego de aplicarse CHG en la piel. No use CHG:  Si es alrgico a la clorhexidina o anteriormente tuvo una reaccin a la clorhexidina.  En bebs de menos de 2 meses. Cmo usar la solucin de CHG  Use CHG nicamente como se lo indique su mdico y siga las instrucciones de la etiqueta.  Use la cantidad de CHG que le indicaron. Usualmente, la medida es una botella. Durante una ducha Siga estos pasos al usar la solucin de CHG durante una ducha (a menos que su mdico le brinde instrucciones diferentes): 1. Comience a ducharse. 2. Lvese la cara y el cabello con el jabn y el champ que utiliza habitualmente. [place] de abajo del agua. 4. Vierta el CHG en un pao limpio. No use ningn  cepillo o esponja con bordes rugosos. 5. Comience en el cuello y colquese la espuma en todo el cuerpo  hasta los pies. Asegrese de seguir estas instrucciones: ? Si le harn una ciruga, preste especial atencin a la parte del cuerpo Research officer, political party. Frote la zona durante al menos 1 minuto. ? No use el CHG en su cabeza o cara. Si la solucin [employer] odos o los ojos, enjuague bien con [place]. ? Evite la zona genital. ? Evite las zonas de la piel que estn lastimadas o tengan cortes o raspaduras. ? Frote su espalda y [employer]. Asegrese de lavar los pliegues de la piel. 6. Deje actuar la espuma por 1 o 2 minutos, o por el tiempo que le haya indicado el mdico. 7. Enjuguese todo el cuerpo bajo la ducha. Asegrese de enjuagar bien todos los pliegues y hendiduras de la piel. 8. Seque con una toalla limpia. Despus no aplique ninguna sustancia en el cuerpo, como polvo, locin o perfume, excepto que se lo indique el mdico. Solo use las lociones recomendadas por el fabricante. 9. Pngase pijama o ropa limpia. 10. Si es la noche anterior a la ciruga, duerma en sbanas limpias.  Durante un bao de esponja Siga estos pasos al usar la solucin de CHG durante un bao de [place] (a menos que su mdico le brinde instrucciones diferentes): 1. Lvese la cara y el cabello con el jabn y el champ que utiliza habitualmente. 2. Vierta el CHG en un pao limpio. 3. Comience en el cuello y colquese la espuma en todo el cuerpo [employer]. Asegrese de seguir estas instrucciones: ? Si le harn una ciruga, preste especial atencin a la parte del cuerpo Research officer, political party. Frote la zona durante al menos 1 minuto. ? No use el CHG en su cabeza o cara. Si la solucin [employer] odos o los ojos, enjuague bien con [place]. ? Evite la zona genital. ? Evite las zonas de la piel que estn lastimadas o tengan cortes o raspaduras. ? Frote su espalda y [employer].  Asegrese de lavar los pliegues de la piel. 4. Deje actuar la espuma por 1 o 2 minutos, o por el tiempo que le haya indicado el mdico. 5. Con un pao limpio y hmedo diferente, enjuguese bien todo el cuerpo. Asegrese de enjuagar bien todos los pliegues y hendiduras de la piel. 6. Seque con una toalla limpia. Despus no aplique ninguna sustancia en el cuerpo, como polvo, locin o perfume, excepto que se lo indique el mdico. Solo use las lociones recomendadas por el fabricante. 7. Pngase pijama o ropa limpia. 8. Si es la noche anterior a la ciruga, duerma en sbanas limpias. [employer] paos preenvasados de CHG  Use los paos de CHG nicamente como se lo haya indicado el mdico y siga las instrucciones de la etiqueta.  Use los paos de CHG sobre la piel limpia y [place].  No use el pao de CHG en la cabeza o el rostro a menos que el mdico se lo indique.  Cuando lave con el pao de CHG: ? Evite la zona genital. ? Evite las zonas de la piel que estn lastimadas o tengan cortes o raspaduras. Antes de la ciruga Siga estos pasos al usar un pao de CHG para limpiar antes de una ciruga (a menos que su mdico le brinde instrucciones diferentes): 1. Con el pao de CHG, frtese con firmeza la parte del cuerpo donde le realizarn la ciruga. Frtese de un lado al otro durante 3 minutos. El rea del cuerpo debe estar  completamente hmeda con CHG cuando termine de frotar. 2. No enjuague. Deseche el pao y deje que el rea se seque al aire. Despus no aplique ninguna sustancia sobre el rea, como polvos, lociones o perfume. 3. Pngase pijama o ropa limpia. 4. Si es la noche anterior a la ciruga, duerma en sbanas limpias.  Para baarse en general Siga estos pasos al usar el pao de CHG para baarse en general (a menos que su mdico le brinde instrucciones diferentes). 1. Use un pao de CHG diferente en cada rea del cuerpo. Asegrese de [employer] de la piel y [place] dedos de  las manos y de los pies. Lvese el cuerpo en el siguiente orden, usando un pao nuevo despus de cada paso: ? La parte delantera del cuello, los hombros y [place]. ? Ambos brazos, debajo de los brazos y [place]. ? El estmago y la zona de la ingle, evitando los genitales. ? La pierna y el pie derechos. ? La pierna y el pie izquierdos. ? La parte posterior del cuello, la espalda y las nalgas. 2. No enjuague. Deseche el pao y deje que el rea se seque al aire. Despus no aplique ninguna sustancia en el cuerpo, como polvo, locin o perfume, excepto que se lo indique el mdico. Solo use las lociones recomendadas por el fabricante. 3. Pngase pijama o ropa limpia. Comunquese con un mdico si:  La piel se irrita despus de frotarla.  Tiene preguntas sobre cmo usar la solucin o el pao. Solicite ayuda inmediatamente si:  Los ojos se ponen muy rojos o Cytogeneticist.  Siente picazn intensa en los ojos.  Siente picazn intensa en la piel y est roja o hinchada.  Nota cambios en su audicin.  Tiene dificultad para ver.  Tiene hinchazn u hormigueo en la garganta o la boca.  Tiene dificultad para respirar.  Traga un poco de clorhexidina. Resumen  El gluconato de clorhexidina (CHG) es una solucin desinfectante (antisptica) que se utiliza para limpiar la piel. Limpiar la piel con CHG puede ayudar a disminuir el riesgo de infeccin.  Es posible que le indiquen que utilice CHG para baarse. Esta solucin puede venir en botella o en un pao preenvasado para que use sobre su piel. Siga cuidadosamente las instrucciones del mdico y las instrucciones que se encuentran en la etiqueta del producto.  No use CHG si es alrgico a la clorhexidina.  Pngase en contacto con su mdico si se le irrita la piel luego de frotar. Esta informacin no tiene Marine scientist el consejo del mdico. Asegrese de hacerle al mdico cualquier pregunta que tenga. Document Revised: 10/26/2018 Document  Reviewed: 08/08/2017 Elsevier Patient Education  [place].

## 2020-02-24 ENCOUNTER — Encounter (HOSPITAL_COMMUNITY): Payer: Self-pay

## 2020-02-24 ENCOUNTER — Other Ambulatory Visit (HOSPITAL_COMMUNITY)
Admission: RE | Admit: 2020-02-24 | Discharge: 2020-02-24 | Disposition: A | Discharge status: Home / Self Care | Payer: HRSA Program | Source: Ambulatory Visit | Attending: Obstetrics and Gynecology | Admitting: Obstetrics and Gynecology

## 2020-02-24 ENCOUNTER — Other Ambulatory Visit: Payer: Self-pay

## 2020-02-24 ENCOUNTER — Encounter (HOSPITAL_COMMUNITY)
Admission: RE | Admit: 2020-02-24 | Discharge: 2020-02-24 | Disposition: A | Discharge status: Home / Self Care | Payer: Self-pay | Source: Ambulatory Visit | Attending: Obstetrics and Gynecology | Admitting: Obstetrics and Gynecology

## 2020-02-24 ENCOUNTER — Other Ambulatory Visit: Payer: Self-pay | Admitting: Obstetrics and Gynecology

## 2020-02-24 DIAGNOSIS — Z20822 Contact with and (suspected) exposure to covid-19: Secondary | ICD-10-CM | POA: Insufficient documentation

## 2020-02-24 DIAGNOSIS — Z01812 Encounter for preprocedural laboratory examination: Secondary | ICD-10-CM | POA: Insufficient documentation

## 2020-02-24 LAB — COMPREHENSIVE METABOLIC PANEL
ALT: 56 U/L — ABNORMAL HIGH (ref 0–44)
AST: 37 U/L (ref 15–41)
Albumin: 4.3 g/dL (ref 3.5–5.0)
Alkaline Phosphatase: 52 U/L (ref 38–126)
Anion gap: 11 (ref 5–15)
BUN: 16 mg/dL (ref 6–20)
CO2: 20 mmol/L — ABNORMAL LOW (ref 22–32)
Calcium: 9 mg/dL (ref 8.9–10.3)
Chloride: 104 mmol/L (ref 98–111)
Creatinine, Ser: 0.37 mg/dL — ABNORMAL LOW (ref 0.44–1.00)
GFR calc Af Amer: >60 mL/min (ref >60)
GFR calc non Af Amer: >60 mL/min (ref >60)
Glucose, Bld: 227 mg/dL — ABNORMAL HIGH (ref 70–99)
Potassium: 3.9 mmol/L (ref 3.5–5.1)
Sodium: 135 mmol/L (ref 135–145)
Total Bilirubin: 0.5 mg/dL (ref 0.3–1.2)
Total Protein: 7.5 g/dL (ref 6.5–8.1)

## 2020-02-24 LAB — SARS CORONAVIRUS 2 (TAT 6-24 HRS): SARS Coronavirus 2: NEGATIVE

## 2020-02-24 LAB — TYPE AND SCREEN
ABO/RH(D): B POS
Antibody Screen: NEGATIVE

## 2020-02-24 LAB — CBC
HCT: 41.7 % (ref 36.0–46.0)
Hemoglobin: 13.5 g/dL (ref 12.0–15.0)
MCH: 25.8 pg — ABNORMAL LOW (ref 26.0–34.0)
MCHC: 32.4 g/dL (ref 30.0–36.0)
MCV: 79.7 fL — ABNORMAL LOW (ref 80.0–100.0)
Platelets: 188 10*3/uL (ref 150–400)
RBC: 5.23 MIL/uL — ABNORMAL HIGH (ref 3.87–5.11)
RDW: 22.6 % — ABNORMAL HIGH (ref 11.5–15.5)
WBC: 5.9 10*3/uL (ref 4.0–10.5)
nRBC: 0 % (ref 0.0–0.2)

## 2020-02-24 LAB — HCG, SERUM, QUALITATIVE: Preg, Serum: NEGATIVE

## 2020-02-24 NOTE — Progress Notes (Signed)
Rhonda Hawkins is an 44 y.o. female. She is admitted for abdominal supracervical hysterectomy, and bilateral salpingectomy She has chronic pelvic discomfort due to the pressure of the fibroids. She has been counseled over the risks of surgery. She has had biopsies of the uterine endometrium listed below.  She had a recent pap that was negative for HPV, + ascus, so followup in 3 years is recommended.  Pertinent Gynecological History: Menses: flow is moderate Bleeding:  Contraception: condoms DES exposure: unknown Blood transfusions: none Sexually transmitted diseases:negative GC  chlamydia Previous GYN Procedures:   endometrial biopsy benign Last mammogram: none reported Date:  Last pap: Ascus , negative HPV, so f/u in 3 yrs per [facility]P guidelines Date: 02/07/2020 OB History: G5, P5   Menstrual History: Menarche age:   Patient's last menstrual period was 02/03/2020.    Past Medical History:  Diagnosis Date  . Diabetes mellitus without complication [facility])     Past Surgical History:  Procedure Laterality Date  . DILATION AND CURETTAGE OF UTERUS    . LAPAROTOMY  02/07/2011   Procedure: LAPAROTOMY;  Surgeon: Jonnie Kind, MD;  Location: [facility];  Service: Gynecology;  Laterality: N/A;  . TUBAL LIGATION  02/07/2011   Procedure: BILATERAL TUBAL LIGATION;  Surgeon: Jonnie Kind, MD;  Location: [facility];  Service: Gynecology;  Laterality: Bilateral;  Bilateral salpingectomy    Family History  Problem Relation Age of Onset  . Anesthesia problems Mother        difficulty awakening after anesthesia  . Anesthesia problems Sister        nausea and vomiting    Social History:  reports that she has never smoked. She has never used smokeless tobacco. She reports that she does not drink alcohol and does not use drugs.  Allergies: No Known Allergies  (Not in a hospital admission)   Review of Systems  Last menstrual period 02/03/2020. Physical Exam Vitals reviewed.   Constitutional:      General: She is not in acute distress.    Appearance: She is normal weight.  HENT:     Head: Normocephalic.  Eyes:     Pupils: Pupils are equal, round, and reactive to light.  Cardiovascular:     Rate and Rhythm: Normal rate and regular rhythm.  Pulmonary:     Effort: Pulmonary effort is normal.     Breath sounds: Normal breath sounds.  Abdominal:     General: Abdomen is flat.     Comments: fibrois palpable to 14 week size  Musculoskeletal:     Cervical back: Normal range of motion.  Neurological:     Mental Status: She is alert.    CBC Latest Ref Rng & Units 02/03/2020 07/05/2018 06/15/2017  WBC 4.0 - 10.5 K/uL 6.6 10.2 -  Hemoglobin 12.0 - 15.0 g/dL 13.5 15.7(H) 12.6  Hematocrit 36 - 46 % 43.0 46.7(H) 37.0  Platelets 150 - 400 K/uL 205 231 -    No results found for this or any previous visit (from the past 24 hour(s)).  No results found.  Assessment/Plan: Symptomatic uterine fibroids, Recent hypokalemia, under treatment     Will undergo abdomnal supracervical hysterectomy and bilateral salpingectomy on 02/28/2020  Jonnie Kind 02/24/2020, 7:08 AM

## 2020-02-24 NOTE — Progress Notes (Signed)
Patient here for PAT with interpreter Binnie Kand Heidrich and patient request using the interpreter line for morning of procedure and Dr Glo Herring to speak with only patient's daughter Jamse Arn planero only

## 2020-02-28 NOTE — Progress Notes (Signed)
Interpreter Sasha [phone]  Pt was called to inform that her surgery would not be done today due to emergency. Dr. Glo Herring will try to reschedule for Friday. Pt and daughter verbalized understanding.

## 2020-02-29 ENCOUNTER — Encounter (HOSPITAL_COMMUNITY)
Admission: RE | Admit: 2020-02-29 | Discharge: 2020-02-29 | Disposition: A | Discharge status: Home / Self Care | Payer: Self-pay | Source: Ambulatory Visit | Attending: Obstetrics and Gynecology | Admitting: Obstetrics and Gynecology

## 2020-02-29 ENCOUNTER — Other Ambulatory Visit: Payer: Self-pay

## 2020-02-29 NOTE — Progress Notes (Signed)
Spanish interpreter [phone] used to call Ms Plantanero-Velazquez.  Patient's daughter Jamse Arn was instructed npo after midnight, drink magnesium citrate at noon 03/01/2020 and liquids diet until midnight.  covid test 03/01/2020 at 8:15am and Surgery 03/02/2020 at 8:00 am

## 2020-03-01 ENCOUNTER — Other Ambulatory Visit: Payer: Self-pay

## 2020-03-01 ENCOUNTER — Other Ambulatory Visit (HOSPITAL_COMMUNITY)
Admission: RE | Admit: 2020-03-01 | Discharge: 2020-03-01 | Disposition: A | Discharge status: Home / Self Care | Payer: HRSA Program | Source: Ambulatory Visit | Attending: Obstetrics and Gynecology | Admitting: Obstetrics and Gynecology

## 2020-03-01 DIAGNOSIS — Z20822 Contact with and (suspected) exposure to covid-19: Secondary | ICD-10-CM | POA: Diagnosis not present

## 2020-03-01 DIAGNOSIS — Z01812 Encounter for preprocedural laboratory examination: Secondary | ICD-10-CM | POA: Diagnosis present

## 2020-03-01 LAB — SARS CORONAVIRUS 2 (TAT 6-24 HRS): SARS Coronavirus 2: NEGATIVE

## 2020-03-02 ENCOUNTER — Inpatient Hospital Stay (HOSPITAL_COMMUNITY): Payer: Self-pay | Admitting: Certified Registered Nurse Anesthetist

## 2020-03-02 ENCOUNTER — Encounter (HOSPITAL_COMMUNITY): Payer: Self-pay | Admitting: Obstetrics and Gynecology

## 2020-03-02 ENCOUNTER — Encounter (HOSPITAL_COMMUNITY)
Admission: RE | Disposition: A | Discharge status: Home / Self Care | Payer: Self-pay | Source: Home / Self Care | Attending: Obstetrics and Gynecology

## 2020-03-02 ENCOUNTER — Inpatient Hospital Stay (HOSPITAL_COMMUNITY)
Admission: RE | Admit: 2020-03-02 | Discharge: 2020-03-03 | DRG: 743 | Disposition: A | Discharge status: Home / Self Care | Payer: Self-pay | Attending: Obstetrics and Gynecology | Admitting: Obstetrics and Gynecology

## 2020-03-02 DIAGNOSIS — D259 Leiomyoma of uterus, unspecified: Principal | ICD-10-CM | POA: Diagnosis present

## 2020-03-02 DIAGNOSIS — E119 Type 2 diabetes mellitus without complications: Secondary | ICD-10-CM | POA: Diagnosis present

## 2020-03-02 DIAGNOSIS — Z7984 Long term (current) use of oral hypoglycemic drugs: Secondary | ICD-10-CM

## 2020-03-02 DIAGNOSIS — Z79899 Other long term (current) drug therapy: Secondary | ICD-10-CM

## 2020-03-02 DIAGNOSIS — N736 Female pelvic peritoneal adhesions (postinfective): Secondary | ICD-10-CM | POA: Diagnosis present

## 2020-03-02 DIAGNOSIS — Z90710 Acquired absence of both cervix and uterus: Secondary | ICD-10-CM | POA: Diagnosis present

## 2020-03-02 DIAGNOSIS — Z9851 Tubal ligation status: Secondary | ICD-10-CM

## 2020-03-02 HISTORY — PX: SUPRACERVICAL ABDOMINAL HYSTERECTOMY: SHX5393

## 2020-03-02 LAB — GLUCOSE, CAPILLARY
Glucose-Capillary: 247 mg/dL — ABNORMAL HIGH (ref 70–99)
Glucose-Capillary: 183 mg/dL — ABNORMAL HIGH (ref 70–99)
Glucose-Capillary: 241 mg/dL — ABNORMAL HIGH (ref 70–99)
Glucose-Capillary: 117 mg/dL — ABNORMAL HIGH (ref 70–99)

## 2020-03-02 SURGERY — HYSTERECTOMY, SUPRACERVICAL, ABDOMINAL
Anesthesia: General | Site: Abdomen

## 2020-03-02 MED ORDER — SCOPOLAMINE 1 MG/3DAYS TD PT72
1.0000 | MEDICATED_PATCH | Freq: Once | TRANSDERMAL | Status: DC
  Administered 2020-03-02: 1.5 mg via TRANSDERMAL

## 2020-03-02 MED ORDER — INSULIN ASPART 100 UNIT/ML ~~LOC~~ SOLN
0.0000 [IU] | Freq: Three times a day (TID) | SUBCUTANEOUS | Status: DC
  Administered 2020-03-02: 5 [IU] via SUBCUTANEOUS
  Administered 2020-03-03: 3 [IU] via SUBCUTANEOUS
  Filled 2020-03-02: qty 0.15

## 2020-03-02 MED ORDER — MIDAZOLAM HCL 2 MG/2ML IJ SOLN
INTRAMUSCULAR | Status: DC | PRN
  Administered 2020-03-02: 2 mg via INTRAVENOUS

## 2020-03-02 MED ORDER — ESMOLOL HCL 100 MG/10ML IV SOLN
INTRAVENOUS | Status: DC | PRN
  Administered 2020-03-02: 10 mg via INTRAVENOUS
  Administered 2020-03-02: 15 mg via INTRAVENOUS

## 2020-03-02 MED ORDER — LACTATED RINGERS IV SOLN
INTRAVENOUS | Status: DC
  Administered 2020-03-03: 1000 mL via INTRAVENOUS

## 2020-03-02 MED ORDER — MIDAZOLAM HCL 2 MG/2ML IJ SOLN
INTRAMUSCULAR | Status: AC
  Filled 2020-03-02: qty 2

## 2020-03-02 MED ORDER — ONDANSETRON HCL 4 MG/2ML IJ SOLN
4.0000 mg | Freq: Four times a day (QID) | INTRAMUSCULAR | Status: DC | PRN
  Administered 2020-03-02: 4 mg via INTRAVENOUS
  Filled 2020-03-02: qty 2

## 2020-03-02 MED ORDER — SCOPOLAMINE 1 MG/3DAYS TD PT72
MEDICATED_PATCH | TRANSDERMAL | Status: AC
  Filled 2020-03-02: qty 1

## 2020-03-02 MED ORDER — 0.9 % SODIUM CHLORIDE (POUR BTL) OPTIME
TOPICAL | Status: DC | PRN
  Administered 2020-03-02: 2000 mL

## 2020-03-02 MED ORDER — FENTANYL CITRATE (PF) 100 MCG/2ML IJ SOLN
INTRAMUSCULAR | Status: DC | PRN
  Administered 2020-03-02: 100 ug via INTRAVENOUS
  Administered 2020-03-02 (×2): 50 ug via INTRAVENOUS
  Administered 2020-03-02: 100 ug via INTRAVENOUS

## 2020-03-02 MED ORDER — ONDANSETRON HCL 4 MG/2ML IJ SOLN: 4.0000 mg | Freq: Once | INTRAMUSCULAR | Status: DC | PRN

## 2020-03-02 MED ORDER — FENTANYL CITRATE (PF) 250 MCG/5ML IJ SOLN
INTRAMUSCULAR | Status: AC
  Filled 2020-03-02: qty 5

## 2020-03-02 MED ORDER — SUGAMMADEX SODIUM 200 MG/2ML IV SOLN
INTRAVENOUS | Status: DC | PRN
  Administered 2020-03-02: 120 mg via INTRAVENOUS

## 2020-03-02 MED ORDER — GABAPENTIN 100 MG PO CAPS
100.0000 mg | ORAL_CAPSULE | Freq: Two times a day (BID) | ORAL | Status: DC
  Administered 2020-03-02 – 2020-03-03 (×2): 100 mg via ORAL
  Filled 2020-03-02 (×2): qty 1

## 2020-03-02 MED ORDER — ESMOLOL HCL 100 MG/10ML IV SOLN
INTRAVENOUS | Status: AC
  Filled 2020-03-02: qty 10

## 2020-03-02 MED ORDER — BUPIVACAINE LIPOSOME 1.3 % IJ SUSP
INTRAMUSCULAR | Status: AC
  Filled 2020-03-02: qty 20

## 2020-03-02 MED ORDER — HYDROMORPHONE HCL 1 MG/ML IJ SOLN
0.2500 mg | INTRAMUSCULAR | Status: DC | PRN
  Administered 2020-03-02 (×4): 0.5 mg via INTRAVENOUS
  Filled 2020-03-02 (×4): qty 0.5

## 2020-03-02 MED ORDER — ORAL CARE MOUTH RINSE: 15.0000 mL | Freq: Once | OROMUCOSAL | Status: AC

## 2020-03-02 MED ORDER — FENOFIBRATE 160 MG PO TABS
160.0000 mg | ORAL_TABLET | Freq: Every day | ORAL | Status: DC
  Administered 2020-03-03: 160 mg via ORAL
  Filled 2020-03-02: qty 1

## 2020-03-02 MED ORDER — ROCURONIUM BROMIDE 100 MG/10ML IV SOLN
INTRAVENOUS | Status: DC | PRN
  Administered 2020-03-02: 40 mg via INTRAVENOUS

## 2020-03-02 MED ORDER — LACTATED RINGERS IV SOLN: INTRAVENOUS | Status: DC

## 2020-03-02 MED ORDER — CHLORHEXIDINE GLUCONATE 0.12 % MT SOLN
OROMUCOSAL | Status: AC
  Filled 2020-03-02: qty 15

## 2020-03-02 MED ORDER — HYDROMORPHONE HCL 1 MG/ML IJ SOLN
0.2000 mg | INTRAMUSCULAR | Status: DC | PRN
  Administered 2020-03-02 – 2020-03-03 (×5): 0.5 mg via INTRAVENOUS
  Filled 2020-03-02 (×5): qty 0.5

## 2020-03-02 MED ORDER — ENOXAPARIN SODIUM 40 MG/0.4ML ~~LOC~~ SOLN
40.0000 mg | SUBCUTANEOUS | Status: DC
  Administered 2020-03-03: 40 mg via SUBCUTANEOUS
  Filled 2020-03-02: qty 0.4

## 2020-03-02 MED ORDER — PROPOFOL 10 MG/ML IV BOLUS
INTRAVENOUS | Status: DC | PRN
  Administered 2020-03-02: 150 mg via INTRAVENOUS

## 2020-03-02 MED ORDER — GLIPIZIDE 10 MG PO TABS
20.0000 mg | ORAL_TABLET | Freq: Two times a day (BID) | ORAL | Status: DC
  Administered 2020-03-02 – 2020-03-03 (×2): 20 mg via ORAL
  Filled 2020-03-02 (×2): qty 2

## 2020-03-02 MED ORDER — KETOROLAC TROMETHAMINE 30 MG/ML IJ SOLN: 30.0000 mg | Freq: Four times a day (QID) | INTRAMUSCULAR | Status: DC

## 2020-03-02 MED ORDER — FENTANYL CITRATE (PF) 100 MCG/2ML IJ SOLN
25.0000 ug | INTRAMUSCULAR | Status: DC | PRN
  Administered 2020-03-02: 50 ug via INTRAVENOUS
  Filled 2020-03-02: qty 2

## 2020-03-02 MED ORDER — ONDANSETRON HCL 4 MG/2ML IJ SOLN
INTRAMUSCULAR | Status: DC | PRN
  Administered 2020-03-02: 4 mg via INTRAVENOUS

## 2020-03-02 MED ORDER — PANTOPRAZOLE SODIUM 40 MG PO TBEC
40.0000 mg | DELAYED_RELEASE_TABLET | Freq: Every day | ORAL | Status: DC
  Administered 2020-03-03: 40 mg via ORAL
  Filled 2020-03-02 (×2): qty 1

## 2020-03-02 MED ORDER — BUPIVACAINE LIPOSOME 1.3 % IJ SUSP
INTRAMUSCULAR | Status: DC | PRN
  Administered 2020-03-02: 20 mL

## 2020-03-02 MED ORDER — ONDANSETRON HCL 4 MG PO TABS
4.0000 mg | ORAL_TABLET | Freq: Four times a day (QID) | ORAL | Status: DC | PRN
  Filled 2020-03-02: qty 1

## 2020-03-02 MED ORDER — LIDOCAINE HCL (CARDIAC) PF 100 MG/5ML IV SOSY
PREFILLED_SYRINGE | INTRAVENOUS | Status: DC | PRN
  Administered 2020-03-02: 60 mg via INTRAVENOUS

## 2020-03-02 MED ORDER — CHLORHEXIDINE GLUCONATE 0.12 % MT SOLN
15.0000 mL | Freq: Once | OROMUCOSAL | Status: AC
  Administered 2020-03-02: 15 mL via OROMUCOSAL

## 2020-03-02 MED ORDER — KETOROLAC TROMETHAMINE 30 MG/ML IJ SOLN
30.0000 mg | Freq: Four times a day (QID) | INTRAMUSCULAR | Status: DC
  Administered 2020-03-02 – 2020-03-03 (×3): 30 mg via INTRAVENOUS
  Filled 2020-03-02 (×3): qty 1

## 2020-03-02 MED ORDER — OXYCODONE HCL 5 MG PO TABS
5.0000 mg | ORAL_TABLET | ORAL | Status: DC | PRN
  Administered 2020-03-02 (×2): 5 mg via ORAL
  Administered 2020-03-03 (×2): 10 mg via ORAL
  Filled 2020-03-02 (×2): qty 1
  Filled 2020-03-02 (×2): qty 2

## 2020-03-02 MED ORDER — SODIUM CHLORIDE 0.9 % IV SOLN
2.0000 g | INTRAVENOUS | Status: AC
  Administered 2020-03-02: 2 g via INTRAVENOUS
  Filled 2020-03-02: qty 2

## 2020-03-02 MED ORDER — POVIDONE-IODINE 10 % EX SWAB: 2.0000 "application " | Freq: Once | CUTANEOUS | Status: DC

## 2020-03-02 SURGICAL SUPPLY — 54 items
APL SKNCLS STERI-STRIP NONHPOA (GAUZE/BANDAGES/DRESSINGS) ×2
BENZOIN TINCTURE PRP APPL 2/3 (GAUZE/BANDAGES/DRESSINGS) ×4 IMPLANT
CLOSURE WOUND 1/2 X4 (GAUZE/BANDAGES/DRESSINGS) ×2
CLOTH BEACON ORANGE TIMEOUT ST (SAFETY) ×4 IMPLANT
COVER LIGHT HANDLE STERIS (MISCELLANEOUS) ×8 IMPLANT
COVER WAND RF STERILE (DRAPES) ×4 IMPLANT
DRAPE WARM FLUID 44X44 (DRAPES) ×4 IMPLANT
DRSG OPSITE POSTOP 4X10 (GAUZE/BANDAGES/DRESSINGS) ×4 IMPLANT
DRSG OPSITE POSTOP 4X8 (GAUZE/BANDAGES/DRESSINGS) ×4 IMPLANT
DURAPREP 26ML APPLICATOR (WOUND CARE) ×8 IMPLANT
ELECT REM PT RETURN 9FT ADLT (ELECTROSURGICAL) ×4
ELECTRODE REM PT RTRN 9FT ADLT (ELECTROSURGICAL) ×2 IMPLANT
GAUZE SPONGE 4X4 16PLY XRAY LF (GAUZE/BANDAGES/DRESSINGS) ×4 IMPLANT
GLOVE BIO SURGEON STRL SZ7 (GLOVE) ×4 IMPLANT
GLOVE BIOGEL PI IND STRL 7.0 (GLOVE) ×6 IMPLANT
GLOVE BIOGEL PI IND STRL 9 (GLOVE) ×2 IMPLANT
GLOVE BIOGEL PI INDICATOR 7.0 (GLOVE) ×6
GLOVE BIOGEL PI INDICATOR 9 (GLOVE) ×2
GLOVE ECLIPSE 7.0 STRL STRAW (GLOVE) ×4 IMPLANT
GLOVE ECLIPSE 9.0 STRL (GLOVE) ×4 IMPLANT
GOWN SPEC L3 XXLG W/TWL (GOWN DISPOSABLE) ×4 IMPLANT
GOWN STRL REUS W/TWL LRG LVL3 (GOWN DISPOSABLE) ×8 IMPLANT
INST SET MAJOR GENERAL (KITS) ×4 IMPLANT
KIT TURNOVER KIT A (KITS) ×4 IMPLANT
MANIFOLD NEPTUNE II (INSTRUMENTS) ×4 IMPLANT
NDL HYPO 18GX1.5 BLUNT FILL (NEEDLE) ×1 IMPLANT
NEEDLE HYPO 18GX1.5 BLUNT FILL (NEEDLE) ×4 IMPLANT
NEEDLE HYPO 22GX1.5 SAFETY (NEEDLE) ×4 IMPLANT
NS IRRIG 1000ML POUR BTL (IV SOLUTION) ×8 IMPLANT
PACK ABDOMINAL MAJOR (CUSTOM PROCEDURE TRAY) ×4 IMPLANT
PAD ARMBOARD 7.5X6 YLW CONV (MISCELLANEOUS) ×4 IMPLANT
PENCIL SMOKE EVACUATOR (MISCELLANEOUS) ×4 IMPLANT
RETRACTOR WND ALEXIS 25 LRG (MISCELLANEOUS) ×2 IMPLANT
RTRCTR WOUND ALEXIS 25CM LRG (MISCELLANEOUS) ×4
SET BASIN LINEN APH (SET/KITS/TRAYS/PACK) ×4 IMPLANT
SPONGE LAP 18X18 RF (DISPOSABLE) ×8 IMPLANT
STRIP CLOSURE SKIN 1/2X4 (GAUZE/BANDAGES/DRESSINGS) ×6 IMPLANT
SUT CHROMIC 0 CT 1 (SUTURE) ×44 IMPLANT
SUT CHROMIC 2 0 CT 1 (SUTURE) ×4 IMPLANT
SUT ETHILON 3 0 FSL (SUTURE) IMPLANT
SUT PDS AB CT VIOLET #0 27IN (SUTURE) IMPLANT
SUT PLAIN CT 1/2CIR 2-0 27IN (SUTURE) ×8 IMPLANT
SUT PROLENE 0 CT 1 30 (SUTURE) IMPLANT
SUT VIC AB 0 CT1 27 (SUTURE) ×4
SUT VIC AB 0 CT1 27XBRD ANTBC (SUTURE) ×2 IMPLANT
SUT VIC AB 0 CT1 27XCR 8 STRN (SUTURE) IMPLANT
SUT VIC AB 2-0 CT1 27 (SUTURE) ×8
SUT VIC AB 2-0 CT1 TAPERPNT 27 (SUTURE) ×4 IMPLANT
SUT VICRYL 4 0 KS 27 (SUTURE) ×4 IMPLANT
SYR 20ML LL LF (SYRINGE) ×8 IMPLANT
SYR BULB IRRIG 60ML STRL (SYRINGE) ×4 IMPLANT
TOWEL SURG RFD BLUE STRL DISP (DISPOSABLE) ×4 IMPLANT
TRAP FLUID SMOKE EVACUATOR (MISCELLANEOUS) ×4 IMPLANT
TRAY FOLEY MTR SLVR 16FR STAT (SET/KITS/TRAYS/PACK) ×4 IMPLANT

## 2020-03-02 NOTE — Anesthesia Procedure Notes (Signed)
Procedure Name: Intubation Date/Time: 03/02/2020 11:09 AM Performed by: Hewitt Blade, CRNA Pre-anesthesia Checklist: Patient identified, Emergency Drugs available, Suction available and Patient being monitored Patient Re-evaluated:Patient Re-evaluated prior to induction Oxygen Delivery Method: Circle system utilized Preoxygenation: Pre-oxygenation with 100% oxygen Induction Type: IV induction Ventilation: Mask ventilation without difficulty Laryngoscope Size: Mac and 3 Grade View: Grade I Tube type: Oral Tube size: 7.0 mm Number of attempts: 1 Airway Equipment and Method: Stylet and Oral airway Placement Confirmation: ETT inserted through vocal cords under direct vision,  positive ETCO2 and breath sounds checked- equal and bilateral Secured at: 20 cm Tube secured with: Tape Dental Injury: Teeth and Oropharynx as per pre-operative assessment

## 2020-03-02 NOTE — Op Note (Signed)
03/02/2020  12:55 PM  PATIENT:  Rhonda Hawkins  44 y.o. female  PRE-OPERATIVE DIAGNOSIS:  Symptomatic Uterine Fibroids 14-week size  POST-OPERATIVE DIAGNOSIS:  Symptomatic Uterine Fibroids  PROCEDURE:  Procedure(s): HYSTERECTOMY SUPRACERVICAL ABDOMINAL (N/A) Note: Both fallopian tubes surgically absent already SURGEON:  Surgeon(s) and Role:    * Glo Herring, Angelyn Punt, MD - Primary  PHYSICIAN ASSISTANT:   ASSISTANTS: Cynthia Wren-registered nurse-FA  ANESTHESIA:   local and general 20 cc Exparel used EBL:  50 mL   BLOOD ADMINISTERED:none  DRAINS: Urinary Catheter (Foley)   LOCAL MEDICATIONS USED:  OTHER Exparel 20 cc  SPECIMEN:  Source of Specimen:  Uterine body with uterine fibroids  DISPOSITION OF SPECIMEN:  PATHOLOGY  COUNTS:  YES  TOURNIQUET:  * No tourniquets in log *  DICTATION: .Dragon Dictation  PLAN OF CARE: Admit for overnight observation  PATIENT DISPOSITION:  PACU - hemodynamically stable.   Delay start of Pharmacological VTE agent (>24hrs) due to surgical blood loss or risk of bleeding: not applicable Details of procedure: Patient was taken to the operating room prepped and draped for lower abdominal surgery with Foley catheter in place,, antibiotics administered and timeout conducted by surgical team and procedure confirmed.  Transverse lower abdominal incision was made in the method Pfannenstiel going from anterior superior iliac crest on 1 side to the opposite side removing an 8 cm wide by 30 cm long ellipse of skin and fatty tissue leaving a layer of fatty tissue approximately 1 cm in thickness above the fascia.  Midline vertical incision was then made into the abdominal cavity being careful to elevate elevate the peritoneum.  Peritoneal entry was without difficulty, with some omental and epiploic fat adhesions to the anterior abdominal wall released as well as some anterior bowel epiploic fat attachments from the transverse colon to the descending  colon which were separated to prevent internal herniation and restore normal anatomy.  Bowel was packed away using moistened laparotomy materials, and Alexis wound retractor positioned.  The uterus could the elevated into the incision.  Round ligament on the left was taken down clamped cut and suture-ligated.  The left ovary was densely adherent to the back of the uterus.  This was sharply dissected free and then the utero-ovarian ligament clamped cut and suture-ligated using 0 chromic.  The bladder flap was taken down anteriorly, and the uterine vessels skeletonized on the left side, doubly clamped with Heaney clamps, backbleeding controlled with a Kelly clamp, the vessels transected and doubly ligated with 0 chromic. The right side was treated in a similar fashion.  Both that and it was clear were inspected and the fallopian tubes had been removed almost completely by prior sterilization the upper cardinal ligaments were clamped with straight Heaney clamp, knife transection and then 0 chromic suture ligature.  At this point we approximately 2 cm above the cervix lip anterior and 1 to 2 cm above the entrance of the uterosacral ligaments into the back of the cervix cuff.  The uterosacral ligaments are quite lax.  The cervix was amputated off of the lower uterine segment in a conical fashion which allowed the cuff of the cervix to be closed front to back with a series of interrupted 0 chromic sutures on each side then continuous running 0 chromic in the middle portion.  Hemostasis was satisfactory.  1 additional interrupted suture was placed on the patient right side.  Medicals were inspected and confirmed is adequately hemostatic.  Pelvis was irrigated.  The peritoneum was oversewn with  2-0 chromic over the cervical stump to reduce potential for adhesions. Pelvis was irrigated laparotomy equipment removed, and due to the absence of any significant amount of fallopian tube on either side no efforts at removing  tubal portions were necessary.  Sponge and needle counts were correct and anterior peritoneum closed with 2-0 chromic, the fascia closed in the midline vertical incision using 0 Vicryl, and then the subcutaneous fatty tissue area irrigated.  Hemostasis was confirmed.  Point cautery was used couple spots that had light pink losing.  The Exparel had been used to inject approximately 3 6 or 4 cc around the cervix before closing the cervix and the remainder of the Exparel was used to inject along the fascia in the midline and under the skin incision at each corner, for a total of 20 cc.  The fascia had been closed in the midline subcutaneous tissues were reapproximated using a series of horizontal mattress sutures of 2-0 plain and then subcuticular 4-0 Vicryl was used to close the skin.  During the subcutaneous fatty closure I had a very superficial penetration of the glove over my right index finger, sufficiently to feel discomfort, but efforts to massage were unable to produce any blood.  Gloves were changed, no bleeding identifiable, and we proceeded with completion surgery.  The needle tip was discarded and not used again. Precautions will be taken for the patient and usual HIV RPR etc. will be checked on the patient  Subcuticular 4-0 Vicryl closure the skin was followed with Steri-Strips and benzoin and patient recovery room in stable condition

## 2020-03-02 NOTE — Brief Op Note (Addendum)
03/02/2020  12:55 PM  PATIENT:  Rhonda Hawkins  44 y.o. female  PRE-OPERATIVE DIAGNOSIS:  Symptomatic Uterine Fibroids 14-week size  POST-OPERATIVE DIAGNOSIS:  Symptomatic Uterine Fibroids  PROCEDURE:  Procedure(s): HYSTERECTOMY SUPRACERVICAL ABDOMINAL (N/A) Note: Both fallopian tubes surgically absent already SURGEON:  Surgeon(s) and Role:    * Glo Herring, Angelyn Punt, MD - Primary  PHYSICIAN ASSISTANT:   ASSISTANTS: Cynthia Wren-registered nurse-FA  ANESTHESIA:   local and general 20 cc Exparel used EBL:  50 mL   BLOOD ADMINISTERED:none  DRAINS: Urinary Catheter (Foley)   LOCAL MEDICATIONS USED:  OTHER Exparel 20 cc  SPECIMEN:  Source of Specimen:  Uterine body with uterine fibroids  DISPOSITION OF SPECIMEN:  PATHOLOGY  COUNTS:  YES  TOURNIQUET:  * No tourniquets in log *  DICTATION: .Dragon Dictation  PLAN OF CARE: Admit for overnight observation  PATIENT DISPOSITION:  PACU - hemodynamically stable.   Delay start of Pharmacological VTE agent (>24hrs) due to surgical blood loss or risk of bleeding: not applicable Details of procedure: Patient was taken to the operating room prepped and draped for lower abdominal surgery with Foley catheter in place,, antibiotics administered and timeout conducted by surgical team and procedure confirmed.  Transverse lower abdominal incision was made in the method Pfannenstiel going from anterior superior iliac crest on 1 side to the opposite side removing an 8 cm wide by 30 cm long ellipse of skin and fatty tissue leaving a layer of fatty tissue approximately 1 cm in thickness above the fascia.  Midline vertical incision was then made into the abdominal cavity being careful to elevate elevate the peritoneum.  Peritoneal entry was without difficulty, with some omental and epiploic fat adhesions to the anterior abdominal wall released as well as some anterior bowel epiploic fat attachments from the transverse colon to the descending  colon which were separated to prevent internal herniation and restore normal anatomy.  Bowel was packed away using moistened laparotomy materials, and Alexis wound retractor positioned.  The uterus could the elevated into the incision.  Round ligament on the left was taken down clamped cut and suture-ligated.  The left ovary was densely adherent to the back of the uterus.  This was sharply dissected free and then the utero-ovarian ligament clamped cut and suture-ligated using 0 chromic.  The bladder flap was taken down anteriorly, and the uterine vessels skeletonized on the left side, doubly clamped with Heaney clamps, backbleeding controlled with a Kelly clamp, the vessels transected and doubly ligated with 0 chromic. The right side was treated in a similar fashion.  Both that and it was clear were inspected and the fallopian tubes had been removed almost completely by prior sterilization the upper cardinal ligaments were clamped with straight Heaney clamp, knife transection and then 0 chromic suture ligature.  At this point we approximately 2 cm above the cervix lip anterior and 1 to 2 cm above the entrance of the uterosacral ligaments into the back of the cervix cuff.  The uterosacral ligaments are quite lax.  The cervix was amputated off of the lower uterine segment in a conical fashion which allowed the cuff of the cervix to be closed front to back with a series of interrupted 0 chromic sutures on each side then continuous running 0 chromic in the middle portion.  Hemostasis was satisfactory.  1 additional interrupted suture was placed on the patient right side.  Medicals were inspected and confirmed is adequately hemostatic.  Pelvis was irrigated.  The peritoneum was oversewn with  2-0 chromic over the cervical stump to reduce potential for adhesions. Pelvis was irrigated laparotomy equipment removed, and due to the absence of any significant amount of fallopian tube on either side no efforts at removing  tubal portions were necessary.  Sponge and needle counts were correct and anterior peritoneum closed with 2-0 chromic, the fascia closed in the midline vertical incision using 0 Vicryl, and then the subcutaneous fatty tissue area irrigated.  Hemostasis was confirmed.  Point cautery was used couple spots that had light pink losing.  The Exparel had been used to inject approximately 3 6 or 4 cc around the cervix before closing the cervix and the remainder of the Exparel was used to inject along the fascia in the midline and under the skin incision at each corner, for a total of 20 cc.  The fascia had been closed in the midline subcutaneous tissues were reapproximated using a series of horizontal mattress sutures of 2-0 plain and then subcuticular 4-0 Vicryl was used to close the skin.  During the subcutaneous fatty closure I had a very superficial penetration of the glove over my right index finger, sufficiently to feel discomfort, but efforts to massage were unable to produce any blood.  Gloves were changed, no bleeding identifiable, and we proceeded with completion surgery.  The needle tip was discarded and not used again. Precautions will be taken for the patient and usual HIV RPR etc. will be checked on the patient  Subcuticular 4-0 Vicryl closure the skin was followed with Steri-Strips and benzoin and patient recovery room in stable condition

## 2020-03-02 NOTE — Progress Notes (Signed)
RN reinforced surgical area with a pressure dressing using 4x4 gauze and transpore tape per Dr. Glo Herring.

## 2020-03-02 NOTE — Anesthesia Preprocedure Evaluation (Signed)
Anesthesia Evaluation  Patient identified by MRN, date of birth, ID band Patient awake    Reviewed: Allergy & Precautions, H&P , NPO status , Patient's Chart, lab work & pertinent test results, reviewed documented beta blocker date and time   Airway Mallampati: II  TM Distance: >3 FB Neck ROM: full    Dental no notable dental hx.    Pulmonary neg pulmonary ROS,    Pulmonary exam normal breath sounds clear to auscultation       Cardiovascular Exercise Tolerance: Good negative cardio ROS   Rhythm:regular Rate:Normal     Neuro/Psych negative neurological ROS  negative psych ROS   GI/Hepatic negative GI ROS, Neg liver ROS,   Endo/Other  negative endocrine ROSdiabetes, Type 2  Renal/GU negative Renal ROS  negative genitourinary   Musculoskeletal   Abdominal   Peds  Hematology negative hematology ROS (+)   Anesthesia Other Findings   Reproductive/Obstetrics negative OB ROS                             Anesthesia Physical Anesthesia Plan  ASA: II  Anesthesia Plan: General   Post-op Pain Management:    Induction:   PONV Risk Score and Plan: Ondansetron  Airway Management Planned:   Additional Equipment:   Intra-op Plan:   Post-operative Plan:   Informed Consent: I have reviewed the patients History and Physical, chart, labs and discussed the procedure including the risks, benefits and alternatives for the proposed anesthesia with the patient or authorized representative who has indicated his/her understanding and acceptance.     Dental Advisory Given  Plan Discussed with: CRNA  Anesthesia Plan Comments:         Anesthesia Quick Evaluation

## 2020-03-02 NOTE — Transfer of Care (Signed)
Immediate Anesthesia Transfer of Care Note  Patient: Rhonda Hawkins  Procedure(s) Performed: HYSTERECTOMY SUPRACERVICAL ABDOMINAL (N/A Abdomen)  Patient Location: PACU  Anesthesia Type:General  Level of Consciousness: awake  Airway & Oxygen Therapy: Patient Spontanous Breathing  Post-op Assessment: Report given to RN and Post -op Vital signs reviewed and stable  Post vital signs: Reviewed and stable  Last Vitals:  Vitals Value Taken Time  BP 124/90 03/02/20 1304  Temp    Pulse 101 03/02/20 1306  Resp 18 03/02/20 1306  SpO2 97 % 03/02/20 1306  Vitals shown include unvalidated device data.  Last Pain:  Vitals:   03/02/20 0945  PainSc: 0-No pain         Complications: No complications documented.

## 2020-03-02 NOTE — Anesthesia Postprocedure Evaluation (Signed)
Anesthesia Post Note  Patient: Rhonda Hawkins  Procedure(s) Performed: HYSTERECTOMY SUPRACERVICAL ABDOMINAL (N/A Abdomen)  Patient location during evaluation: PACU Anesthesia Type: General Level of consciousness: awake Pain management: pain level controlled Vital Signs Assessment: post-procedure vital signs reviewed and stable Respiratory status: spontaneous breathing, nonlabored ventilation and respiratory function stable Cardiovascular status: stable Postop Assessment: no apparent nausea or vomiting Anesthetic complications: no   No complications documented.   Last Vitals:  Vitals:   03/02/20 1315 03/02/20 1330  BP: (!) 129/91 128/90  Pulse: 98 88  Resp: 17 19  Temp: (!) 36.4 C   SpO2: 97% 95%    Last Pain:  Vitals:   03/02/20 1315  PainSc: Asleep                 Mckinleigh Schuchart Hristova

## 2020-03-02 NOTE — H&P (Signed)
Rhonda Hawkins is an 44 y.o. female. She is admitted for Abdominal supracervical hysterectomy and bilateral salpingectomy for symptomatic painful enlarged fibroid uterus.Endometrial biopsy is benign and last pap ASCUS with negative HPV so q 3 yr testing is appropriate  Pertinent Gynecological History: Menses: flow is moderate Bleeding:  Contraception: condoms tubal DES exposure: unknown Blood transfusions: none Sexually transmitted diseases: no past history Previous GYN Procedures: endo biopsy benign  Last mammogram: normal Date:  Last pap: normal ASCUS with NEG HPV  Date: 01/2020 OB History: P   Menstrual History: Menarche age:  Patient's last menstrual period was 02/03/2020.    Past Medical History:  Diagnosis Date  . Diabetes mellitus without complication [facility])     Past Surgical History:  Procedure Laterality Date  . DILATION AND CURETTAGE OF UTERUS    . LAPAROTOMY  02/07/2011   Procedure: LAPAROTOMY;  Surgeon: Jonnie Kind, MD;  Location: [facility];  Service: Gynecology;  Laterality: N/A;  . TUBAL LIGATION  02/07/2011   Procedure: BILATERAL TUBAL LIGATION;  Surgeon: Jonnie Kind, MD;  Location: [facility];  Service: Gynecology;  Laterality: Bilateral;  Bilateral salpingectomy    Family History  Problem Relation Age of Onset  . Anesthesia problems Mother        difficulty awakening after anesthesia  . Anesthesia problems Sister        nausea and vomiting    Social History:  reports that she has never smoked. She has never used smokeless tobacco. She reports that she does not drink alcohol and does not use drugs.  Allergies: No Known Allergies  Medications Prior to Admission  Medication Sig Dispense Refill Last Dose  . ergocalciferol (VITAMIN D2) 1.25 MG (50000 UT) capsule Take 50,000 Units by mouth every Wednesday.    03/01/2020 at Unknown time  . fenofibrate (TRICOR) 145 MG tablet Take 145 mg by mouth daily.   03/01/2020 at Unknown time  . ferrous sulfate  325 (65 FE) MG tablet Take 325 mg by mouth every evening.   03/01/2020 at Unknown time  . glipiZIDE (GLUCOTROL) 10 MG tablet Take 20 mg by mouth 2 (two) times daily before a meal.    03/01/2020 at Unknown time  . ibuprofen (ADVIL) 200 MG tablet Take 200 mg by mouth 3 (three) times daily as needed (pain.).     Marland Kitchen Multiple Vitamins-Minerals (ADULT GUMMY PO) Take 2 tablets by mouth daily.   03/01/2020 at Unknown time  . potassium chloride SA (KLOR-CON) 20 MEQ tablet Take 2 tablets (40 mEq total) by mouth daily. (Patient taking differently: Take 40 mEq by mouth every evening. ) 30 tablet 1 03/01/2020 at Unknown time  . traMADol (ULTRAM) 50 MG tablet Take 1 tablet (50 mg total) by mouth every 6 (six) hours as needed for moderate pain or severe pain. 30 tablet 0 03/02/2020 at 0700    Review of Systems  Blood pressure 122/84, pulse (!) 104, temperature 98.1 F (36.7 C), resp. rate 15, last menstrual period 02/03/2020, SpO2 99 %. Physical Exam Vitals reviewed.  Constitutional:      Appearance: Normal appearance. She is normal weight.  HENT:     Head: Normocephalic.     Nose: No congestion.     Mouth/Throat:     Pharynx: Oropharynx is clear.  Eyes:     Pupils: Pupils are equal, round, and reactive to light.  Cardiovascular:     Rate and Rhythm: Normal rate.     Pulses: Normal pulses.  Pulmonary:  Effort: Pulmonary effort is normal.  Abdominal:     General: Abdomen is flat.     Palpations: There is mass.     Comments: Fibroid uterus,14-16 wk size and abdominal laxity with excess skin.  Neurological:     Mental Status: She is alert.     Results for orders placed or performed during the hospital encounter of 03/02/20 (from the past 24 hour(s))  Glucose, capillary     Status: Abnormal   Collection Time: 03/02/20  9:20 AM  Result Value Ref Range   Glucose-Capillary 247 (H) 70 - 99 mg/dL   CBC    Component Value Date/Time   WBC 5.9 02/24/2020 0930   RBC 5.23 (H) 02/24/2020 0930   HGB  13.5 02/24/2020 0930   HCT 41.7 02/24/2020 0930   PLT 188 02/24/2020 0930   MCV 79.7 (L) 02/24/2020 0930   MCH 25.8 (L) 02/24/2020 0930   MCHC 32.4 02/24/2020 0930   RDW 22.6 (H) 02/24/2020 0930   LYMPHSABS 2.2 10/10/2014 1040   MONOABS 0.5 10/10/2014 1040   EOSABS 0.1 10/10/2014 1040   BASOSABS 0.0 10/10/2014 1040   CMP Latest Ref Rng & Units 02/24/2020 02/03/2020 07/05/2018  Glucose 70 - 99 mg/dL 227(H) 259(H) 431(H)  BUN 6 - 20 mg/dL 16 9 10   Creatinine 0.44 - 1.00 mg/dL 0.37(L) 0.51 0.51  Sodium 135 - 145 mmol/L 135 135 134(L)  Potassium 3.5 - 5.1 mmol/L 3.9 3.2(L) 3.9  Chloride 98 - 111 mmol/L 104 104 103  CO2 22 - 32 mmol/L 20(L) 18(L) 18(L)  Calcium 8.9 - 10.3 mg/dL 9.0 8.7(L) 8.7(L)  Total Protein 6.5 - 8.1 g/dL 7.5 7.6 7.8  Total Bilirubin 0.3 - 1.2 mg/dL 0.5 0.6 1.2  Alkaline Phos 38 - 126 U/L 52 56 67  AST 15 - 41 U/L 37 25 21  ALT 0 - 44 U/L 56(H) 29 30     No results found.  Assessment/Plan: Symptomatic fibroid uterus '  Plan Abdominal supracervical hysterectomy , bilateral salpingectomy. Plans reviewed again including risks and alternatives , thru translator.  Jonnie Kind 03/02/2020, 10:46 AM

## 2020-03-03 LAB — BASIC METABOLIC PANEL
Anion gap: 8 (ref 5–15)
BUN: 8 mg/dL (ref 6–20)
CO2: 24 mmol/L (ref 22–32)
Calcium: 7.6 mg/dL — ABNORMAL LOW (ref 8.9–10.3)
Chloride: 104 mmol/L (ref 98–111)
Creatinine, Ser: 0.46 mg/dL (ref 0.44–1.00)
GFR calc Af Amer: >60 mL/min (ref >60)
GFR calc non Af Amer: >60 mL/min (ref >60)
Glucose, Bld: 131 mg/dL — ABNORMAL HIGH (ref 70–99)
Potassium: 3.8 mmol/L (ref 3.5–5.1)
Sodium: 136 mmol/L (ref 135–145)

## 2020-03-03 LAB — CBC
HCT: 36.2 % (ref 36.0–46.0)
Hemoglobin: 11.6 g/dL — ABNORMAL LOW (ref 12.0–15.0)
MCH: 26.4 pg (ref 26.0–34.0)
MCHC: 32 g/dL (ref 30.0–36.0)
MCV: 82.3 fL (ref 80.0–100.0)
Platelets: 175 10*3/uL (ref 150–400)
RBC: 4.4 MIL/uL (ref 3.87–5.11)
RDW: 20.4 % — ABNORMAL HIGH (ref 11.5–15.5)
WBC: 5.8 10*3/uL (ref 4.0–10.5)
nRBC: 0 % (ref 0.0–0.2)

## 2020-03-03 LAB — HEMOGLOBIN A1C
Hgb A1c MFr Bld: 7.3 % — ABNORMAL HIGH (ref 4.8–5.6)
Mean Plasma Glucose: 162.81 mg/dL

## 2020-03-03 LAB — GLUCOSE, CAPILLARY
Glucose-Capillary: 106 mg/dL — ABNORMAL HIGH (ref 70–99)
Glucose-Capillary: 161 mg/dL — ABNORMAL HIGH (ref 70–99)

## 2020-03-03 MED ORDER — OXYCODONE-ACETAMINOPHEN 5-325 MG PO TABS: 1.0000 | ORAL_TABLET | ORAL | 0 refills | Status: DC | PRN

## 2020-03-03 MED ORDER — ONDANSETRON HCL 4 MG PO TABS: 4.0000 mg | ORAL_TABLET | Freq: Four times a day (QID) | ORAL | 0 refills | Status: DC | PRN

## 2020-03-03 MED ORDER — DOCUSATE SODIUM 100 MG PO CAPS
100.0000 mg | ORAL_CAPSULE | Freq: Two times a day (BID) | ORAL | 2 refills | Status: DC | PRN
Start: 2020-03-03 — End: 2024-06-17

## 2020-03-03 NOTE — Progress Notes (Signed)
Awake. Sat on side of bed x 5 minutes. Walked with assistance in PACU. Tolerated well. Returned to [employer]. Resting comfortably. Daughter at side.

## 2020-03-03 NOTE — Discharge Summary (Signed)
Physician Discharge Summary  Patient ID: Rhonda Hawkins MRN: [id number] DOB/AGE: 04/08/1976 44 y.o.  Admit date: 03/02/2020 Discharge date: 03/03/2020  Admission Diagnoses:uterine fibroids 14-16 wk symptomatic  Discharge Diagnoses:  Active Problems:   S/P abdominal hysterectomy   Discharged Condition: good  Hospital Course: Admitted for supracervical hysterectomy and wide excision of cicatrix.   Consults: None  Significant Diagnostic Studies: labs:  CBC Latest Ref Rng & Units 03/03/2020 02/24/2020 02/03/2020  WBC 4.0 - 10.5 K/uL 5.8 5.9 6.6  Hemoglobin 12.0 - 15.0 g/dL 11.6(L) 13.5 13.5  Hematocrit 36 - 46 % 36.2 41.7 43.0  Platelets 150 - 400 K/uL 175 188 205   . and .cbgs  CMP Latest Ref Rng & Units 03/03/2020 02/24/2020 02/03/2020  Glucose 70 - 99 mg/dL 131(H) 227(H) 259(H)  BUN 6 - 20 mg/dL 8 16 9   Creatinine 0.44 - 1.00 mg/dL 0.46 0.37(L) 0.51  Sodium 135 - 145 mmol/L 136 135 135  Potassium 3.5 - 5.1 mmol/L 3.8 3.9 3.2(L)  Chloride 98 - 111 mmol/L 104 104 104  CO2 22 - 32 mmol/L 24 20(L) 18(L)  Calcium 8.9 - 10.3 mg/dL 7.6(L) 9.0 8.7(L)  Total Protein 6.5 - 8.1 g/dL - 7.5 7.6  Total Bilirubin 0.3 - 1.2 mg/dL - 0.5 0.6  Alkaline Phos 38 - 126 U/L - 52 56  AST 15 - 41 U/L - 37 25  ALT 0 - 44 U/L - 56(H) 29     Treatments: surgery: abdominal supracervical  hysterectromy  Discharge Exam: Blood pressure 103/66, pulse 96, temperature 98.2 F (36.8 C), resp. rate 16, last menstrual period 02/03/2020, SpO2 97 %.   Disposition: d/c home     Follow-up Information    Jonnie Kind, MD Follow up in 2 week(s).   Specialties: Obstetrics and Gynecology, Radiology Contact information: [place] [id number] [phone]               Signed: Jonnie Kind 03/03/2020, 11:33 AM

## 2020-03-03 NOTE — Progress Notes (Signed)
Returned to bed. C/O postop abd pain. Rates pain 9. Med as noted.

## 2020-03-03 NOTE — Progress Notes (Signed)
Ambulated to BR. Voided yellow urine without difficulty. Returned to bed. Tolerated well.

## 2020-03-03 NOTE — Progress Notes (Signed)
Ready to go home. Dressed with assistance from daughter. D/C to home via w/c in good condition.

## 2020-03-03 NOTE — Discharge Instructions (Signed)
Histerectoma abdominal Abdominal Hysterectomy La histerectoma abdominal es un procedimiento quirrgico en el que se extirpa el tero. El tero es el rgano muscular donde se desarrolla el feto. Se puede realizar esta ciruga si:  Tiene cncer.  Tiene crecimientos (tumores o fibromas) en el tero.  Tiene dolor a Barrister's clerk (crnico).  Tiene sangrado.  El tero ha descendido hacia la vagina (prolapso uterino).  Tiene una enfermedad en la que el tejido que rodea al tero crece fuera de su ubicacin normal (endometriosis).  Tiene una infeccin en el tero.  Tiene problemas con el ciclo menstrual. Dependiendo del motivo por el cual se le realiza este procedimiento, puede que tambin se le extirpen otros rganos genitales. Por ejemplo:  La parte de la vagina que se conecta con el tero (cuello uterino).  Los rganos de las mujeres que producen vulos (ovarios).  Los tubos que [employer] tero con los ovarios (trompas de [place]). Informe al mdico acerca de lo siguiente:  Cualquier alergia que tenga.  Todos los [employer], incluidos vitaminas, hierbas, gotas oftlmicas, cremas y medicamentos de venta libre.  Cualquier problema previo que usted o los miembros de su familia hayan tenido con anestsicos.  Cualquier enfermedad de la sangre que tenga.  Cirugas previas a las que se someti.  Cualquier enfermedad que tenga.  Si est embarazada o podra estarlo. Cules son los riesgos? En general, se trata de un procedimiento seguro. Sin embargo, pueden ocurrir complicaciones, por ejemplo:  Hemorragia.  Infeccin.  Reacciones alrgicas a los medicamentos o a los tintes.  Daos a Catering manager u otros rganos.  Lesiones en los nervios.  Menor inters sexual o Scientist, research (physical sciences).  Cogulos sanguneos que pueden desprenderse y Sports administrator a los pulmones. Qu ocurre antes del procedimiento? Mantenerse hidratada Siga las indicaciones del mdico acerca de  la hidratacin, las cuales pueden incluir lo siguiente:  Hasta 2horas antes del procedimiento, puede beber lquidos transparentes, como agua, jugos frutales transparentes, caf negro y t solo. Restricciones en las comidas y bebidas Siga las indicaciones del mdico respecto de las comidas y bebidas, las cuales pueden incluir lo siguiente:  Ocho horas antes del procedimiento, deje de ingerir comidas o alimentos pesados, por ejemplo, carne, alimentos fritos o alimentos grasos.  Seis horas antes del procedimiento, deje de ingerir comidas o alimentos livianos, como tostadas o cereales.  Seis horas antes del procedimiento, deje de beber [place] o bebidas que [employer].  Dos horas antes del procedimiento, deje de beber lquidos transparentes. Medicamentos  Consulte al mdico si debe hacer o no lo siguiente: ? Cambiar o suspender los medicamentos que toma habitualmente. Esto es muy importante si toma medicamentos para la diabetes o anticoagulantes. ? Tomar medicamentos como aspirina e ibuprofeno. Estos medicamentos pueden tener un efecto anticoagulante en la [place]. No tome estos medicamentos antes del procedimiento si su mdico le indica que no lo haga.  Pueden indicarle un antibitico para ayudar a prevenir infecciones. Tmelo como se lo haya indicado el mdico.  Es probable que deba tomar laxantes para Engineer, civil (consulting). Instrucciones generales  Pregntele al mdico cmo se marcar o se Museum/gallery curator de la ciruga.  Le indicarn que se duche con un jabn antisptico para eliminar las bacterias de la piel.  Haga planes para que una persona la lleve a su casa desde el hospital.  No consuma ningn producto que contenga nicotina o tabaco, como cigarrillos y Psychologist, sport and exercise. Si necesita ayuda para dejar de fumar, consulte al [employer].  Pueden  hacerle un examen o anlisis.  Pueden extraerle Truddie Coco de [place] o pedirle [place] de [place].  Es probable que  deba realizarse un enema para limpiar el recto y colon inferior.  Este procedimiento puede afectar la percepcin que tiene de usted [place]. Hable con el mdico [employer] fsicos y emocionales que puede causar este procedimiento. Qu ocurre durante el procedimiento?  Para reducir el riesgo de infecciones: ? El equipo mdico se lavar o se desinfectar las manos. ? Le lavarn la piel con jabn. ? Pueden rasurarle la zona [place].  Le colocarn un tubo (catter) intravenoso en una de las venas.  Le administrarn uno o ms de los siguientes medicamentos: ? Un medicamento para ayudarla a relajarse (sedante). ? Un medicamento para hacerla dormir (anestesia general).  Le colocarn medias ajustadas (compresin) en las piernas para Product manager.  Le insertarn un tubo delgado y flexible (catter) para drenar la [place].  El cirujano har un corte (incisin) en la piel, en la parte inferior del abdomen. La incisin puede ser horizontal o vertical.  El cirujano apartar el tejido que recubre al tero. Luego, extraer con cuidado el tero junto con cualquier otro rgano que necesite extirpar.  La hemorragia se controlar con pinzas o suturas.  El cirujano cerrar la incisin con puntos (suturas), goma para cerrar la piel o tiras [id number].  Se colocar una venda (vendaje) sobre la incisin. Este procedimiento puede variar segn el mdico y el hospital. Sander Nephew sucede despus del procedimiento?  Le darn analgsicos si los necesita.  Le controlarn la presin arterial, la frecuencia cardaca, la frecuencia respiratoria y Retail buyer de oxgeno en la sangre hasta que desaparezca el efecto de los medicamentos administrados.  [place]ber Field seismologist hospital [place] recuperarse. Pregntele a su mdico por cunto Audiological scientist hospital luego del procedimiento.  Probablemente, al principio, deba seguir una dieta lquida. Lo ms probable es que retome  su dieta habitual al da siguiente de la ciruga.  An tendr el catter urinario. Es probable que se lo retiren Hydrographic surveyor de la ciruga.  Quizs [employer] de compresin. Estas medias ayudan a Mining engineer formacin de cogulos de [place] y a reducir la hinchazn de las piernas.  La alentarn a caminar lo ms pronto posible. Adems, usar un dispositivo o har ejercicios de respiracin para mantener los pulmones limpios.  Probablemente deba usar un apsito sanitario para secreciones vaginales. Resumen  La histerectoma abdominal es un procedimiento quirrgico en el que se extirpa el tero. El tero es el rgano muscular donde se desarrolla el feto.  Este procedimiento puede afectar la percepcin que tiene de usted [place]. Hable con el mdico [employer] fsicos y emocionales que puede causar este procedimiento.  Despus del procedimiento, le darn medicamentos para Best boy.  Deber Field seismologist hospital hasta tanto se recupere. Pregntele a su mdico por cunto Audiological scientist hospital luego del procedimiento. Esta informacin no tiene Marine scientist el consejo del mdico. Asegrese de hacerle al mdico cualquier pregunta que tenga. Document Revised: 01/09/2017 Document Reviewed: 01/09/2017 Elsevier Patient Education  [place].

## 2020-03-03 NOTE — Progress Notes (Signed)
Awake. Sitting in recliner eating breakfast. Ate 50% of food. Tolerated well.

## 2020-03-03 NOTE — Progress Notes (Signed)
Dr Glo Herring in to check pt. Talked with pt. AM lab results reviewed. Cleared for d/c to home. Orders entered.

## 2020-03-04 ENCOUNTER — Other Ambulatory Visit: Payer: Self-pay | Admitting: Obstetrics and Gynecology

## 2020-03-04 ENCOUNTER — Other Ambulatory Visit: Payer: Self-pay

## 2020-03-04 ENCOUNTER — Encounter (HOSPITAL_COMMUNITY): Payer: Self-pay | Admitting: Emergency Medicine

## 2020-03-04 ENCOUNTER — Emergency Department (HOSPITAL_COMMUNITY)
Admission: EM | Admit: 2020-03-04 | Discharge: 2020-03-04 | Disposition: A | Discharge status: Home / Self Care | Payer: Self-pay | Attending: Emergency Medicine | Admitting: Emergency Medicine

## 2020-03-04 DIAGNOSIS — M549 Dorsalgia, unspecified: Secondary | ICD-10-CM | POA: Insufficient documentation

## 2020-03-04 DIAGNOSIS — Z7984 Long term (current) use of oral hypoglycemic drugs: Secondary | ICD-10-CM | POA: Insufficient documentation

## 2020-03-04 DIAGNOSIS — E119 Type 2 diabetes mellitus without complications: Secondary | ICD-10-CM | POA: Insufficient documentation

## 2020-03-04 DIAGNOSIS — R102 Pelvic and perineal pain: Secondary | ICD-10-CM | POA: Insufficient documentation

## 2020-03-04 DIAGNOSIS — G8918 Other acute postprocedural pain: Secondary | ICD-10-CM | POA: Insufficient documentation

## 2020-03-04 LAB — COMPREHENSIVE METABOLIC PANEL
ALT: 48 U/L — ABNORMAL HIGH (ref 0–44)
AST: 27 U/L (ref 15–41)
Albumin: 3.4 g/dL — ABNORMAL LOW (ref 3.5–5.0)
Alkaline Phosphatase: 53 U/L (ref 38–126)
Anion gap: 8 (ref 5–15)
BUN: 8 mg/dL (ref 6–20)
CO2: 20 mmol/L — ABNORMAL LOW (ref 22–32)
Calcium: 7.7 mg/dL — ABNORMAL LOW (ref 8.9–10.3)
Chloride: 107 mmol/L (ref 98–111)
Creatinine, Ser: 0.48 mg/dL (ref 0.44–1.00)
GFR calc Af Amer: >60 mL/min (ref >60)
GFR calc non Af Amer: >60 mL/min (ref >60)
Glucose, Bld: 214 mg/dL — ABNORMAL HIGH (ref 70–99)
Potassium: 3.2 mmol/L — ABNORMAL LOW (ref 3.5–5.1)
Sodium: 135 mmol/L (ref 135–145)
Total Bilirubin: 0.6 mg/dL (ref 0.3–1.2)
Total Protein: 6.7 g/dL (ref 6.5–8.1)

## 2020-03-04 LAB — CBC WITH DIFFERENTIAL/PLATELET
Abs Immature Granulocytes: 0.02 10*3/uL (ref 0.00–0.07)
Basophils Absolute: 0 10*3/uL (ref 0.0–0.1)
Basophils Relative: 0 %
Eosinophils Absolute: 0 10*3/uL (ref 0.0–0.5)
Eosinophils Relative: 0 %
HCT: 35.4 % — ABNORMAL LOW (ref 36.0–46.0)
Hemoglobin: 11.6 g/dL — ABNORMAL LOW (ref 12.0–15.0)
Immature Granulocytes: 0 %
Lymphocytes Relative: 26 %
Lymphs Abs: 1.7 10*3/uL (ref 0.7–4.0)
MCH: 26.5 pg (ref 26.0–34.0)
MCHC: 32.8 g/dL (ref 30.0–36.0)
MCV: 80.8 fL (ref 80.0–100.0)
Monocytes Absolute: 0.7 10*3/uL (ref 0.1–1.0)
Monocytes Relative: 10 %
Neutro Abs: 4.2 10*3/uL (ref 1.7–7.7)
Neutrophils Relative %: 64 %
Platelets: 186 10*3/uL (ref 150–400)
RBC: 4.38 MIL/uL (ref 3.87–5.11)
RDW: 19.9 % — ABNORMAL HIGH (ref 11.5–15.5)
WBC: 6.7 10*3/uL (ref 4.0–10.5)
nRBC: 0 % (ref 0.0–0.2)

## 2020-03-04 LAB — URINALYSIS, ROUTINE W REFLEX MICROSCOPIC
Bilirubin Urine: NEGATIVE
Glucose, UA: 250 mg/dL — AB
Hgb urine dipstick: NEGATIVE
Ketones, ur: 40 mg/dL — AB
Nitrite: NEGATIVE
Protein, ur: NEGATIVE mg/dL
Specific Gravity, Urine: 1.015 (ref 1.005–1.030)
pH: 7.5 (ref 5.0–8.0)

## 2020-03-04 LAB — URINALYSIS, MICROSCOPIC (REFLEX)

## 2020-03-04 LAB — TYPE AND SCREEN
ABO/RH(D): B POS
Antibody Screen: NEGATIVE

## 2020-03-04 MED ORDER — SODIUM CHLORIDE 0.9 % IV BOLUS
1000.0000 mL | Freq: Once | INTRAVENOUS | Status: AC
  Administered 2020-03-04: 1000 mL via INTRAVENOUS

## 2020-03-04 MED ORDER — HYDROMORPHONE HCL 1 MG/ML IJ SOLN
1.0000 mg | Freq: Once | INTRAMUSCULAR | Status: AC
  Administered 2020-03-04: 1 mg via INTRAVENOUS
  Filled 2020-03-04: qty 1

## 2020-03-04 MED ORDER — ONDANSETRON HCL 4 MG/2ML IJ SOLN
4.0000 mg | Freq: Once | INTRAMUSCULAR | Status: AC
  Administered 2020-03-04: 4 mg via INTRAVENOUS
  Filled 2020-03-04: qty 2

## 2020-03-04 NOTE — Discharge Instructions (Signed)
Take your pain medication as prescribed every 4 hours.  You may take 2 at 1 time but do not take additional Tylenol with these medications.  Call Dr. Glo Herring for an appointment as soon as possible to be reevaluated.  Return to the ED for worsening pain, fever, vomiting, any other concerns.

## 2020-03-04 NOTE — ED Provider Notes (Signed)
[facility] Provider Note   CSN: [id number] Arrival date & time: 03/04/20  0029     History Chief Complaint  Patient presents with   Post-op Problem    Rhonda Hawkins is a 44 y.o. female.  Level 5 caveat for language barrier.  Patient here with abdominal and pelvic pain at the site of her surgery.  She underwent abdominal hysterectomy by Dr. Glo Herring on August 13.  She was released on August 14 around 1 PM.  She has been taking Percocet at home without relief.  States her last dose was around 3 PM and she did not take her 9:00 dose because it has not been working.  States her pain was controlled in the hospital with both p.o. and IV medications.  It is worsened since she has gone home however.  She denies any bleeding from her incision or vaginal bleeding.  No fever, chills, nausea or vomiting.  No pain with urination or blood in urine.  The pain is across her lower pelvis and low back.  Radiates to her buttocks.  She does not notice any bleeding from her incision.  She did not take her 3 PM Percocet because she did not feel that it was working.  Denies any blood thinner use.  Believes she still has her ovaries.  Temperature On arrival was 99.8 but no fever at home.  The history is provided by the patient. The history is limited by a language barrier. A language interpreter was used.       Past Medical History:  Diagnosis Date   Diabetes mellitus without complication [facility])     Patient Active Problem List   Diagnosis Date Noted   S/P abdominal hysterectomy 03/02/2020    Past Surgical History:  Procedure Laterality Date   DILATION AND CURETTAGE OF UTERUS     LAPAROTOMY  02/07/2011   Procedure: LAPAROTOMY;  Surgeon: Jonnie Kind, MD;  Location: [facility];  Service: Gynecology;  Laterality: N/A;   TUBAL LIGATION  02/07/2011   Procedure: BILATERAL TUBAL LIGATION;  Surgeon: Jonnie Kind, MD;  Location: [facility];  Service: Gynecology;   Laterality: Bilateral;  Bilateral salpingectomy     OB History    Gravida  5   Para  5   Term      Preterm      AB      Living  5     SAB      TAB      Ectopic      Multiple      Live Births              Family History  Problem Relation Age of Onset   Anesthesia problems Mother        difficulty awakening after anesthesia   Anesthesia problems Sister        nausea and vomiting    Social History   Tobacco Use   Smoking status: Never Smoker   Smokeless tobacco: Never Used  Vaping Use   Vaping Use: Never used  Substance Use Topics   Alcohol use: No   Drug use: No    Home Medications Prior to Admission medications   Medication Sig Start Date End Date Taking? Authorizing Provider  docusate sodium (COLACE) 100 MG capsule Take 1 capsule (100 mg total) by mouth 2 (two) times daily as needed. 03/03/20   Jonnie Kind, MD  ergocalciferol (VITAMIN D2) 1.25 MG (50000 UT) capsule Take 50,000 Units by  mouth every Wednesday.     [provider]  fenofibrate (TRICOR) 145 MG tablet Take 145 mg by mouth daily.    [provider]  ferrous sulfate 325 (65 FE) MG tablet Take 325 mg by mouth every evening.    [provider]  glipiZIDE (GLUCOTROL) 10 MG tablet Take 20 mg by mouth 2 (two) times daily before a meal.     [provider]  ibuprofen (ADVIL) 200 MG tablet Take 200 mg by mouth 3 (three) times daily as needed (pain.).    [provider]  Multiple Vitamins-Minerals (ADULT GUMMY PO) Take 2 tablets by mouth daily.    [provider]  ondansetron (ZOFRAN) 4 MG tablet Take 1 tablet (4 mg total) by mouth every 6 (six) hours as needed for nausea. 03/03/20   Jonnie Kind, MD  oxyCODONE-acetaminophen (PERCOCET/ROXICET) 5-325 MG tablet Take 1 tablet by mouth every 4 (four) hours as needed. 03/03/20   Jonnie Kind, MD  potassium chloride SA (KLOR-CON) 20 MEQ tablet Take 2 tablets (40 mEq total) by mouth  daily. Patient taking differently: Take 40 mEq by mouth every evening.  02/06/20   Jonnie Kind, MD    Allergies    Patient has no known allergies.  Review of Systems   Review of Systems  Constitutional: Negative for activity change, appetite change, fatigue and fever.  HENT: Negative for congestion and rhinorrhea.   Eyes: Negative for visual disturbance.  Respiratory: Negative for cough, chest tightness and shortness of breath.   Cardiovascular: Negative for chest pain.  Gastrointestinal: Positive for abdominal pain. Negative for nausea and vomiting.  Genitourinary: Positive for pelvic pain.  Musculoskeletal: Positive for back pain. Negative for arthralgias and myalgias.  Skin: Negative for rash.  Neurological: Negative for dizziness, weakness and headaches.   all other systems are negative except as noted in the HPI and PMH.    Physical Exam Updated Vital Signs BP 116/67 (BP Location: Right Arm)    Pulse 99    Temp 99.8 F (37.7 C) (Oral)    Resp 16    Ht 4\' 10"  (1.473 m)    Wt 52.6 kg    LMP 02/03/2020    SpO2 100%    BMI 24.24 kg/m   Physical Exam Vitals and nursing note reviewed.  Constitutional:      General: She is not in acute distress.    Appearance: She is well-developed.     Comments: Uncomfortable  HENT:     Head: Normocephalic and atraumatic.     Mouth/Throat:     Pharynx: No oropharyngeal exudate.  Eyes:     Conjunctiva/sclera: Conjunctivae normal.     Pupils: Pupils are equal, round, and reactive to light.  Neck:     Comments: No meningismus. Cardiovascular:     Rate and Rhythm: Normal rate and regular rhythm.     Heart sounds: Normal heart sounds. No murmur heard.   Pulmonary:     Effort: Pulmonary effort is normal. No respiratory distress.     Breath sounds: Normal breath sounds.  Abdominal:     Palpations: Abdomen is soft.     Tenderness: There is abdominal tenderness. There is no guarding or rebound.     Comments: Surgical dressing and  honeycomb in place.  There is no bleeding or drainage.  There is diffuse tenderness across her lower abdomen.  There is no guarding or rebound  Musculoskeletal:        General: No tenderness. Normal  range of motion.     Cervical back: Normal range of motion and neck supple.     Comments: Paraspinal lumbar tenderness bilaterally  Skin:    General: Skin is warm.  Neurological:     Mental Status: She is alert and oriented to person, place, and time.     Cranial Nerves: No cranial nerve deficit.     Motor: No abnormal muscle tone.     Coordination: Coordination normal.     Comments:  5/5 strength throughout. CN 2-12 intact.Equal grip strength.   Psychiatric:        Behavior: Behavior normal.     ED Results / Procedures / Treatments   Labs (all labs ordered are listed, but only abnormal results are displayed) Labs Reviewed  CBC WITH DIFFERENTIAL/PLATELET - Abnormal; Notable for the following components:      Result Value   Hemoglobin 11.6 (*)    HCT 35.4 (*)    RDW 19.9 (*)    All other components within normal limits  COMPREHENSIVE METABOLIC PANEL - Abnormal; Notable for the following components:   Potassium 3.2 (*)    CO2 20 (*)    Glucose, Bld 214 (*)    Calcium 7.7 (*)    Albumin 3.4 (*)    ALT 48 (*)    All other components within normal limits  URINALYSIS, ROUTINE W REFLEX MICROSCOPIC - Abnormal; Notable for the following components:   Glucose, UA 250 (*)    Ketones, ur 40 (*)    Leukocytes,Ua TRACE (*)    All other components within normal limits  URINALYSIS, MICROSCOPIC (REFLEX) - Abnormal; Notable for the following components:   Bacteria, UA RARE (*)    All other components within normal limits  TYPE AND SCREEN    EKG None  Radiology No results found.  Procedures Procedures (including critical care time)  Medications Ordered in ED Medications  sodium chloride 0.9 % bolus 1,000 mL (has no administration in time range)  HYDROmorphone (DILAUDID) injection 1  mg (has no administration in time range)  ondansetron (ZOFRAN) injection 4 mg (has no administration in time range)    ED Course  I have reviewed the triage vital signs and the nursing notes.  Pertinent labs & imaging results that were available during my care of the patient were reviewed by me and considered in my medical decision making (see chart for details).    MDM Rules/Calculators/A&P                         Abdominal pelvic pain after hysterectomy 2 days ago.  No fever.  Stable vital signs.    Stable vital signs.  Abdomen seems appropriately tender without guarding or rebound.  Patient given IV fluids as well as pain control.  Labs show stable hemoglobin.  Hyperglycemia without evidence of DKA. Normal anion gap.   Discussed with Dr. Kennon Rounds on-call for Dr. Glo Herring.  She feels with stable vital signs, no fever, stable hemoglobin and no leukocytosis patient's pain can be treated and she can go home.  Does not recommend any abdominal imaging at this point.  She does not have any guarding or rebound.  Her abdomen appears appropriately tender.  Pain improved in the ED.  Abdomen soft and reevaluation.  Vitals are stable. Temperature normal on recheck.  Results discussed with patient and her family.  She is requesting something stronger for pain than Percocet.  Discussed that she needs to take the Percocet as  prescribed every 4 hours and she may take 2 at a time but do not take any additional acetaminophen with it.  Advised to call Dr. Glo Herring in the morning for follow-up.  Return to the ED with worsening pain, fever, vomiting, or other concerns. Final Clinical Impression(s) / ED Diagnoses Final diagnoses:  Post-operative pain    Rx / DC Orders ED Discharge Orders    None       Monay Houlton, Annie Main, MD 03/04/20 [phone]

## 2020-03-04 NOTE — ED Triage Notes (Signed)
Pt reports post op pain. Pt had abdominal hysterectomy on the 13th and was released on the 14th around 1300. Pt has taken percocet at home with no relief.

## 2020-03-05 ENCOUNTER — Telehealth: Payer: Self-pay | Admitting: Obstetrics and Gynecology

## 2020-03-05 ENCOUNTER — Encounter (HOSPITAL_COMMUNITY): Payer: Self-pay | Admitting: Obstetrics and Gynecology

## 2020-03-05 ENCOUNTER — Ambulatory Visit (INDEPENDENT_AMBULATORY_CARE_PROVIDER_SITE_OTHER): Payer: Self-pay | Admitting: Obstetrics and Gynecology

## 2020-03-05 ENCOUNTER — Ambulatory Visit: Payer: Self-pay | Admitting: Obstetrics and Gynecology

## 2020-03-05 VITALS — BP 100/63 | HR 76 | Ht 59.0 in | Wt 121.2 lb

## 2020-03-05 DIAGNOSIS — L7682 Other postprocedural complications of skin and subcutaneous tissue: Secondary | ICD-10-CM

## 2020-03-05 LAB — SURGICAL PATHOLOGY

## 2020-03-05 MED ORDER — OXYCODONE-ACETAMINOPHEN 5-325 MG PO TABS: 1.0000 | ORAL_TABLET | Freq: Four times a day (QID) | ORAL | 0 refills | Status: AC | PRN

## 2020-03-05 MED ORDER — KETOROLAC TROMETHAMINE 10 MG PO TABS
10.0000 mg | ORAL_TABLET | Freq: Four times a day (QID) | ORAL | 0 refills | Status: DC
Start: 2020-03-05 — End: 2020-06-26

## 2020-03-05 NOTE — Telephone Encounter (Signed)
Gentleman left a message on machine that [place] needed refill on her Oxy. In a lot of pain went to Er and they told her to take 2 pills every 4 hours.

## 2020-03-05 NOTE — Progress Notes (Signed)
Patient ID: Rhonda Hawkins, female   DOB: 1975-12-03, 44 y.o.   MRN: [id number]     Subjective:  Rhonda Hawkins is a 44 y.o. female now 3 weeks status post supracervical abdominal hysterectomy. She reports a lot of lower abdominal pain. She has had to take Percocet every 4 hours to manage the pain, and it does not provide her with complete relief.  Review of Systems Negative except for lower abdominal pain   Diet: normal   Bowel movements: normal. Pain uncontrolled with Percocet q 4 hours  Objective:  BP 100/63 (BP Location: Right Arm, Patient Position: Sitting, Cuff Size: Normal)   Pulse 76   Ht 4\' 11"  (1.499 m)   Wt 121 lb 3.2 oz (55 kg)   BMI 24.48 kg/m  General:Well developed, well nourished.  No acute distress. Abdomen: Bowel sounds normal, soft, non-tender. abd soft Pelvic Exam: DEFERRED  Incision(s): Honey comb dressing in place   Assessment:  Post-Op 3 days s/p supracervical abdominal hysterectomy.  Doing well postoperatively except for pain at the incision site. Normal bowel sounds.    Plan:  1. Wound care discussed. Remove honeycomb dressing in a couple of days. Leave steri strips in place. 2.   Current medications: RefillPercocet, add Tramadol 3.   Activity restrictions: no bending, stooping, or squatting 4.   Return to work: not applicable. 5.   Follow up in 1 week.  By signing my name below, I, De Burrs, attest that this documentation has been prepared under the direction and in the presence of Jonnie Kind, MD. Electronically Signed: De Burrs, Medical Scribe. 03/05/20. 4:47 PM.  I personally performed the services described in this documentation, which was SCRIBED in my presence. The recorded information has been reviewed and considered accurate. It has been edited as necessary during review. Jonnie Kind, MD

## 2020-03-05 NOTE — Telephone Encounter (Signed)
Pt's husband called for refill on Oxycodone. Pt went to [facility] ER yesterday and was instructed to take 2 tabs every 4 hours. Pt has 4 tabs left. Will you refill med? Thanks!! [place]

## 2020-03-07 ENCOUNTER — Encounter: Payer: Self-pay | Admitting: Obstetrics and Gynecology

## 2020-03-07 NOTE — Telephone Encounter (Signed)
Pt seen in office

## 2020-03-14 ENCOUNTER — Encounter: Payer: Self-pay | Admitting: Obstetrics and Gynecology

## 2020-03-14 ENCOUNTER — Ambulatory Visit (INDEPENDENT_AMBULATORY_CARE_PROVIDER_SITE_OTHER): Payer: Self-pay | Admitting: Obstetrics and Gynecology

## 2020-03-14 VITALS — BP 103/73 | HR 92 | Ht 59.0 in | Wt 118.6 lb

## 2020-03-14 DIAGNOSIS — Z90710 Acquired absence of both cervix and uterus: Secondary | ICD-10-CM

## 2020-03-14 NOTE — Progress Notes (Signed)
PATIENT ID: Rhonda Hawkins, female     DOB: 17-Nov-1975, 44 y.o.     MRN: [id number]   She is accompanied by a [place] as needed.   Subjective:  Rhonda Hawkins is a 44 y.o. female now 2 weeks status post supracervical hysterectomy.  With wide excision of old incision, and pannus  She still had some pain. She mostly has the pain when she walks or when she stands up for some time. She would like a refill on her pain medication as she still does have some pain.  Review of Systems Negative Diet:   Regular Bowel movements : normal. Pain is controlled with current analgesics. Medications being used: ibuprofen (OTC) and narcotic analgesics including oxycodone/acetaminophen (Percocet, Tylox).  Objective:  LMP 02/03/2020  General:Well developed, well nourished.  No acute distress. Abdomen: Bowel sounds normal, soft, non-tender.  Incision(s): Healing well, no drainage, no erythema, no hernia, no swelling, no dehiscence.      Assessment:  Post-Op 2 weeks s/p supracervical hysterectomy   Excellent recovery postoperatively.   Plan:  1.Wound care discussed will use ibuprofen every 6 hours and tramadol as needed for pain management.  May shower may resume light activities without lifting greater than 15 pounds 2. Current medications: Refill ibuprofen. Prescribe Rx Tramadol instead of Percocet.  3. Activity restrictions: no lifting more than 10 pounds 4. Return to work: not applicable. 5. Follow up in 4 weeks.   By signing my name below, I, [employer], attest that this documentation has been prepared under the direction and in the presence of Jonnie Kind, MD. Electronically Signed: [place]. 03/14/20. 1:40 PM.  I personally performed the services described in this documentation, which was SCRIBED in my presence. The recorded information has been reviewed and considered accurate. It has been edited as necessary during review. Jonnie Kind, MD

## 2020-03-15 ENCOUNTER — Telehealth: Payer: Self-pay | Admitting: Obstetrics & Gynecology

## 2020-03-15 ENCOUNTER — Telehealth: Payer: Self-pay | Admitting: Obstetrics and Gynecology

## 2020-03-15 MED ORDER — IBUPROFEN 800 MG PO TABS: 800.0000 mg | ORAL_TABLET | Freq: Three times a day (TID) | ORAL | 0 refills | Status: DC | PRN

## 2020-03-15 MED ORDER — TRAMADOL HCL 50 MG PO TABS: 50.0000 mg | ORAL_TABLET | Freq: Four times a day (QID) | ORAL | 0 refills | Status: DC | PRN

## 2020-03-15 NOTE — Telephone Encounter (Signed)
Dr. Glo Herring didn't send prescriptions to pharmacy. I spoke with Dr. Elonda Husky. He advised he would send Ibuprofen to pharmacy but pt will have to come by and pick up Tramadol prescription. Pt's daughter voiced understanding. [place]

## 2020-03-15 NOTE — Telephone Encounter (Signed)
Patient called stating that she came to her appointment yesterday an d Dr. Glo Herring was suppose to call her in a medication Oxycodone for her pain after surgery and he sis not call it in. Please contact pt when medication has been called in.

## 2020-03-28 DIAGNOSIS — E781 Pure hyperglyceridemia: Secondary | ICD-10-CM | POA: Insufficient documentation

## 2020-03-28 DIAGNOSIS — E559 Vitamin D deficiency, unspecified: Secondary | ICD-10-CM | POA: Insufficient documentation

## 2020-03-28 DIAGNOSIS — E876 Hypokalemia: Secondary | ICD-10-CM | POA: Insufficient documentation

## 2020-04-11 ENCOUNTER — Encounter: Payer: Self-pay | Admitting: Obstetrics and Gynecology

## 2020-06-26 ENCOUNTER — Encounter: Payer: Self-pay | Admitting: Obstetrics & Gynecology

## 2020-06-26 ENCOUNTER — Ambulatory Visit (INDEPENDENT_AMBULATORY_CARE_PROVIDER_SITE_OTHER): Payer: Self-pay | Admitting: Obstetrics & Gynecology

## 2020-06-26 ENCOUNTER — Other Ambulatory Visit: Payer: Self-pay

## 2020-06-26 VITALS — BP 105/71 | HR 97 | Ht 59.0 in | Wt 121.5 lb

## 2020-06-26 DIAGNOSIS — G588 Other specified mononeuropathies: Secondary | ICD-10-CM

## 2020-06-26 NOTE — Progress Notes (Signed)
Chief Complaint  Patient presents with  . pain in left side      44 y.o. G5P5 Patient's last menstrual period was 02/03/2020. The current method of family planning is status post hysterectomy.  Outpatient Encounter Medications as of 06/26/2020  Medication Sig  . Cholecalciferol 1.25 MG (50000 UT) TABS   . docusate sodium (COLACE) 100 MG capsule Take 1 capsule (100 mg total) by mouth 2 (two) times daily as needed.  . fenofibrate (TRICOR) 145 MG tablet Take 145 mg by mouth daily.  . ferrous sulfate 325 (65 FE) MG tablet Take 325 mg by mouth every evening.  Marland Kitchen glipiZIDE (GLUCOTROL) 10 MG tablet Take 20 mg by mouth 2 (two) times daily before a meal.   . ibuprofen (ADVIL) 200 MG tablet Take 200 mg by mouth 3 (three) times daily as needed (pain.).  Marland Kitchen ibuprofen (ADVIL) 800 MG tablet Take 1 tablet (800 mg total) by mouth every 8 (eight) hours as needed.  . ondansetron (ZOFRAN) 4 MG tablet Take 1 tablet (4 mg total) by mouth every 6 (six) hours as needed for nausea.  . potassium chloride SA (KLOR-CON) 20 MEQ tablet Take 2 tablets (40 mEq total) by mouth every evening.  . traMADol (ULTRAM) 50 MG tablet Take 1 tablet (50 mg total) by mouth every 6 (six) hours as needed.  . [DISCONTINUED] ketorolac (TORADOL) 10 MG tablet Take 1 tablet (10 mg total) by mouth every 6 (six) hours. For up to 5 days  . [DISCONTINUED] Multiple Vitamins-Minerals (ADULT GUMMY PO) Take 2 tablets by mouth daily.  . [DISCONTINUED] oxyCODONE-acetaminophen (PERCOCET/ROXICET) 5-325 MG tablet Take 1 tablet by mouth every 4 (four) hours as needed.   No facility-administered encounter medications on file as of 06/26/2020.    Subjective S/P supracervical abdominal hysterectomy 03/02/20 with abdominoplasty as well by Dr Glo Herring Has not been seen here since 03/14/20 Presents complaining of point tenderness pain on left side for about 1 mpnth Well away from the incision just inferiro to the umbilicus at the rectus abdominus  transverse Past Medical History:  Diagnosis Date  . Diabetes mellitus without complication [facility])     Past Surgical History:  Procedure Laterality Date  . DILATION AND CURETTAGE OF UTERUS    . LAPAROTOMY  02/07/2011   Procedure: LAPAROTOMY;  Surgeon: Jonnie Kind, MD;  Location: [facility];  Service: Gynecology;  Laterality: N/A;  . SUPRACERVICAL ABDOMINAL HYSTERECTOMY N/A 03/02/2020   Procedure: HYSTERECTOMY SUPRACERVICAL ABDOMINAL;  Surgeon: Jonnie Kind, MD;  Location: [facility];  Service: Gynecology;  Laterality: N/A;  . TUBAL LIGATION  02/07/2011   Procedure: BILATERAL TUBAL LIGATION;  Surgeon: Jonnie Kind, MD;  Location: [facility];  Service: Gynecology;  Laterality: Bilateral;  Bilateral salpingectomy    OB History    Gravida  5   Para  5   Term      Preterm      AB      Living  5     SAB      TAB      Ectopic      Multiple      Live Births              No Known Allergies  Social History   Socioeconomic History  . Marital status: Single    Spouse name: Not on file  . Number of children: Not on file  . Years of education: Not on file  . Highest education level: Not on  file  Occupational History  . Not on file  Tobacco Use  . Smoking status: Never Smoker  . Smokeless tobacco: Never Used  Vaping Use  . Vaping Use: Never used  Substance and Sexual Activity  . Alcohol use: No  . Drug use: No  . Sexual activity: Not Currently    Birth control/protection: Surgical    Comment: [facility]  Other Topics Concern  . Not on file  Social History Narrative  . Not on file   Social Determinants of Health   Financial Resource Strain: High Risk  . Difficulty of Paying Living Expenses: Very hard  Food Insecurity: Food Insecurity Present  . Worried About Charity fundraiser in the Last Year: Sometimes true  . Ran Out of Food in the Last Year: Sometimes true  Transportation Needs: Unmet Transportation Needs  . Lack of Transportation (Medical): Yes  . Lack of  Transportation (Non-Medical): Yes  Physical Activity: Insufficiently Active  . Days of Exercise per Week: 2 days  . Minutes of Exercise per Session: 20 min  Stress: Stress Concern Present  . Feeling of Stress : Rather much  Social Connections: Socially Isolated  . Frequency of Communication with Friends and Family: Once a week  . Frequency of Social Gatherings with Friends and Family: Once a week  . Attends Religious Services: 1 to 4 times per year  . Active Member of Clubs or Organizations: No  . Attends Archivist Meetings: Never  . Marital Status: Never married    Family History  Problem Relation Age of Onset  . Anesthesia problems Mother        difficulty awakening after anesthesia  . Anesthesia problems Sister        nausea and vomiting    Medications:       Current Outpatient Medications:  .  Cholecalciferol 1.25 MG (50000 UT) TABS, , Disp: , Rfl:  .  docusate sodium (COLACE) 100 MG capsule, Take 1 capsule (100 mg total) by mouth 2 (two) times daily as needed., Disp: 30 capsule, Rfl: 2 .  fenofibrate (TRICOR) 145 MG tablet, Take 145 mg by mouth daily., Disp: , Rfl:  .  ferrous sulfate 325 (65 FE) MG tablet, Take 325 mg by mouth every evening., Disp: , Rfl:  .  glipiZIDE (GLUCOTROL) 10 MG tablet, Take 20 mg by mouth 2 (two) times daily before a meal. , Disp: , Rfl:  .  ibuprofen (ADVIL) 200 MG tablet, Take 200 mg by mouth 3 (three) times daily as needed (pain.)., Disp: , Rfl:  .  ibuprofen (ADVIL) 800 MG tablet, Take 1 tablet (800 mg total) by mouth every 8 (eight) hours as needed., Disp: 30 tablet, Rfl: 0 .  ondansetron (ZOFRAN) 4 MG tablet, Take 1 tablet (4 mg total) by mouth every 6 (six) hours as needed for nausea., Disp: 20 tablet, Rfl: 0 .  potassium chloride SA (KLOR-CON) 20 MEQ tablet, Take 2 tablets (40 mEq total) by mouth every evening., Disp: 30 tablet, Rfl: 0 .  traMADol (ULTRAM) 50 MG tablet, Take 1 tablet (50 mg total) by mouth every 6 (six) hours as  needed., Disp: 30 tablet, Rfl: 0  Objective Blood pressure 105/71, pulse 97, height 4\' 11"  (1.499 m), weight 121 lb 8 oz (55.1 kg), last menstrual period 02/03/2020.  +Carnett's sign on left, iliohypogastric distribution at the rectus abdominus/transverse junction where the nerve ganglia are Otherwise benign exam with well healed abdominoplasty incision  Pertinent ROS No burning with urination, frequency or  urgency No nausea, vomiting or diarrhea Nor fever chills or other constitutional symptoms   Labs or studies     Impression Diagnoses this Encounter::   ICD-10-CM   1. 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   Labs or Scans Ordered: No orders of the defined types were placed in this encounter.   Management:: Reassess in 3 weeks fo rpossible repeat injection  Follow up No follow-ups on file.       All questions were answered.

## 2020-07-17 ENCOUNTER — Ambulatory Visit: Payer: Self-pay | Admitting: Obstetrics & Gynecology

## 2020-08-13 ENCOUNTER — Ambulatory Visit: Payer: Self-pay | Admitting: Obstetrics & Gynecology

## 2021-01-17 DIAGNOSIS — F458 Other somatoform disorders: Secondary | ICD-10-CM | POA: Insufficient documentation

## 2021-01-21 IMAGING — US US PELVIS COMPLETE WITH TRANSVAGINAL
1 series · 13 of 25 positions shown · non-contrast
Comparison: CT abdomen and pelvis 07/05/2018

CLINICAL DATA: Pelvic pain for 2 years, uterine fibroid seen on CT;
LMP 01/05/2020



[Series 1: us pelvis complete with transvaginal · 0.22mm/px · 13 of 88 slices shown]
[im 1/88]
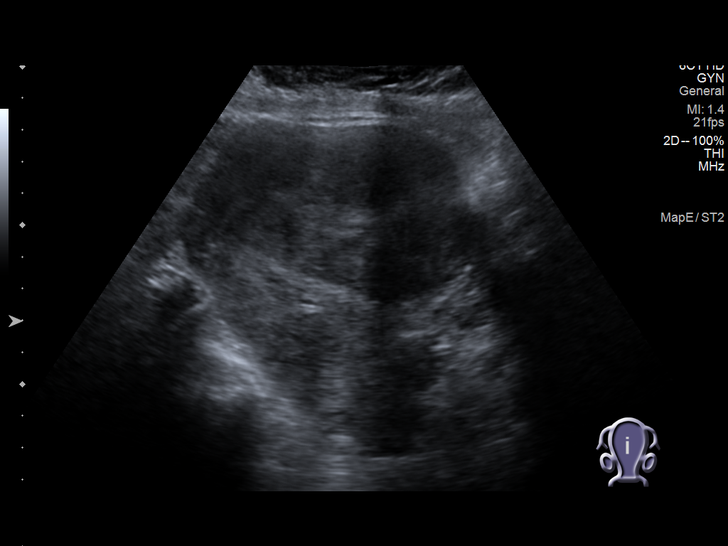
[im 8/88]
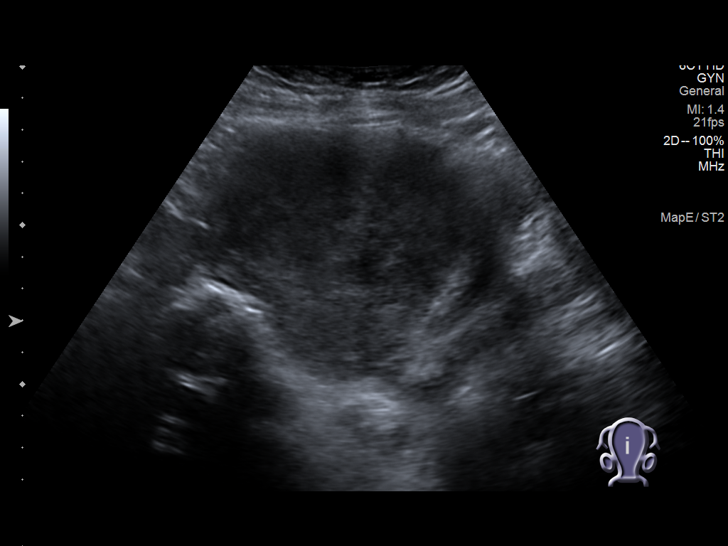
[im 15/88]
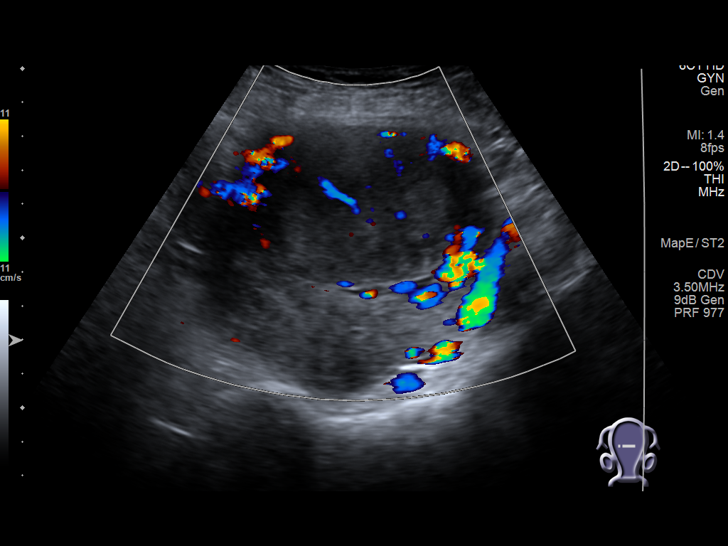
[im 22/88]
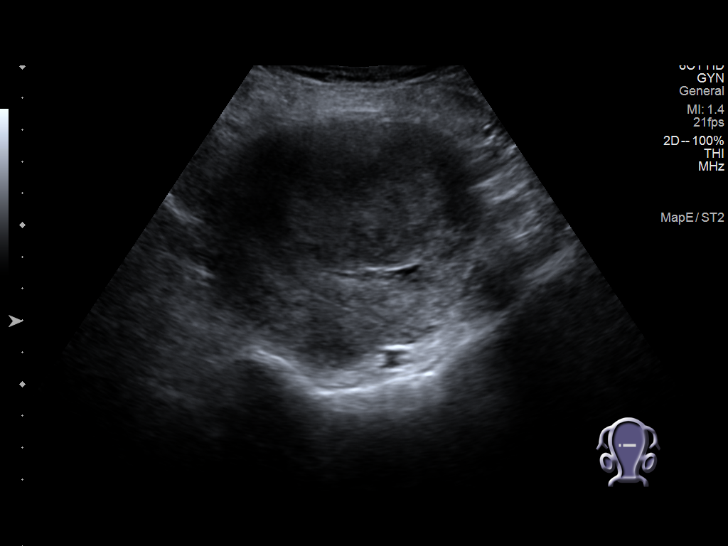
[im 30/88]
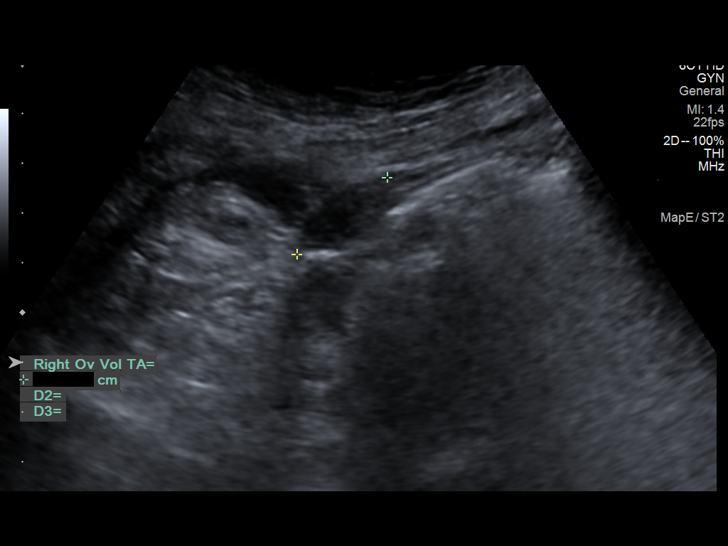
[im 37/88]
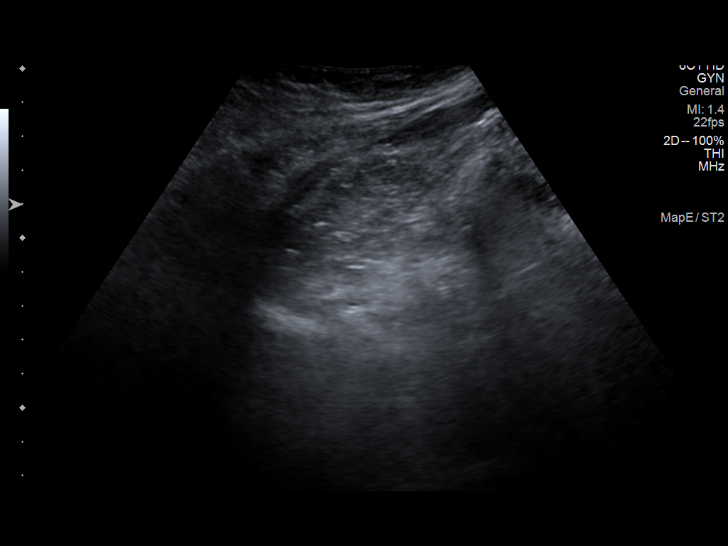
[im 44/88]
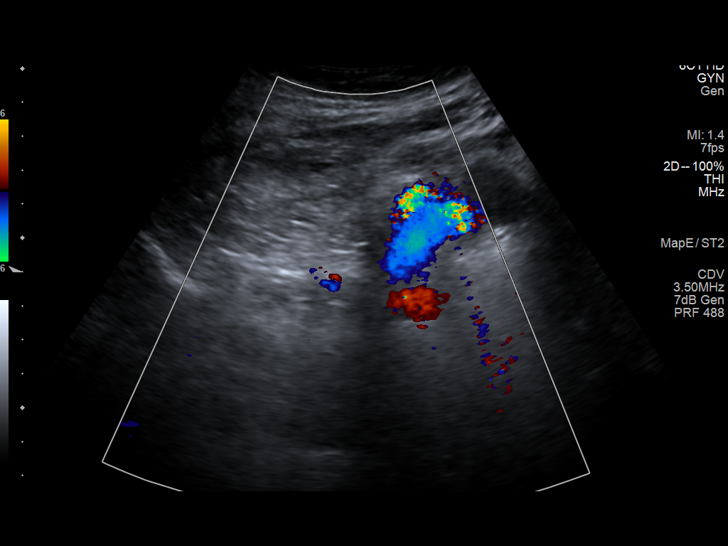
[im 51/88]
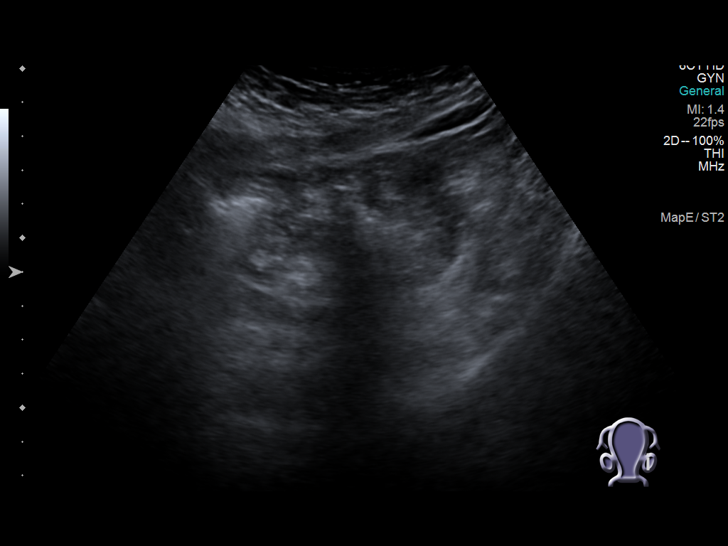
[im 59/88]
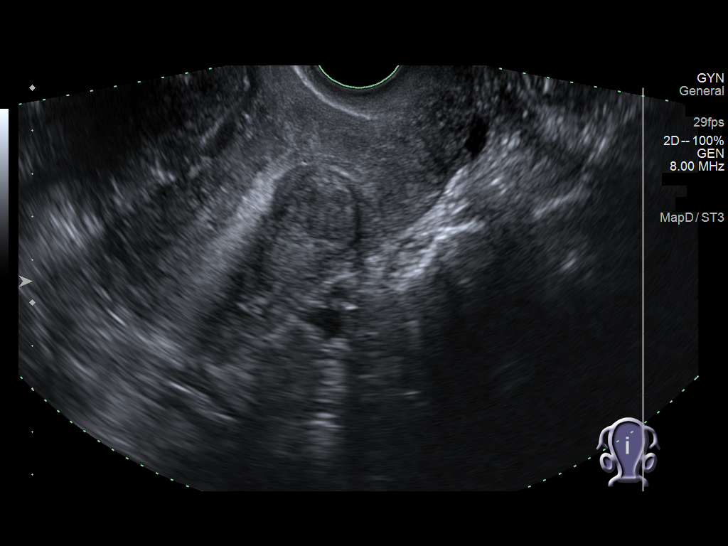
[im 66/88]
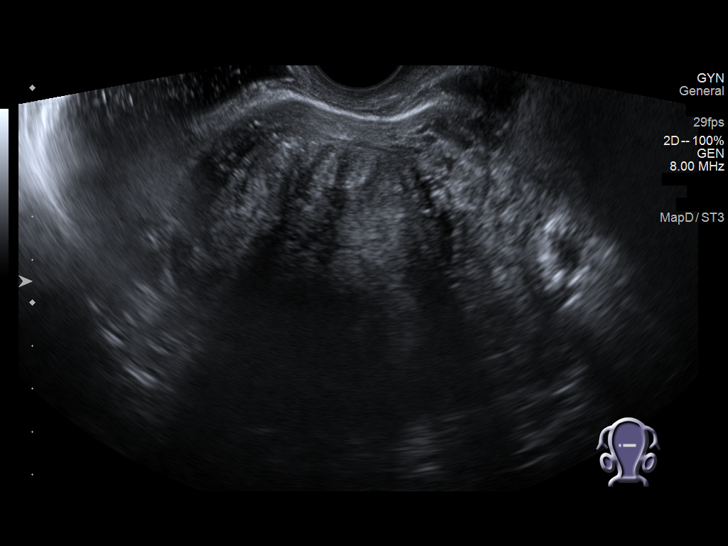
[im 73/88]
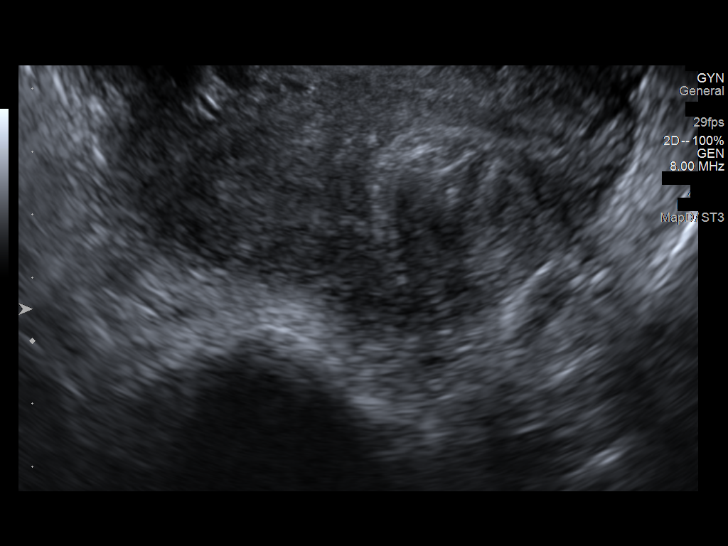
[im 80/88]
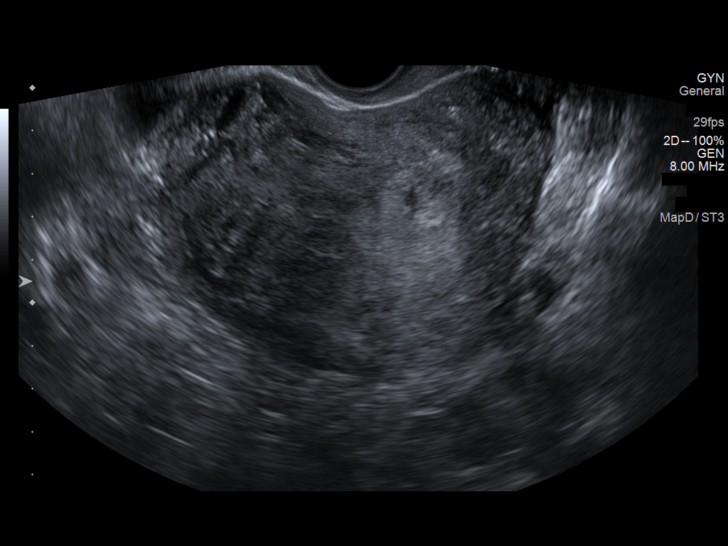
[im 88/88]
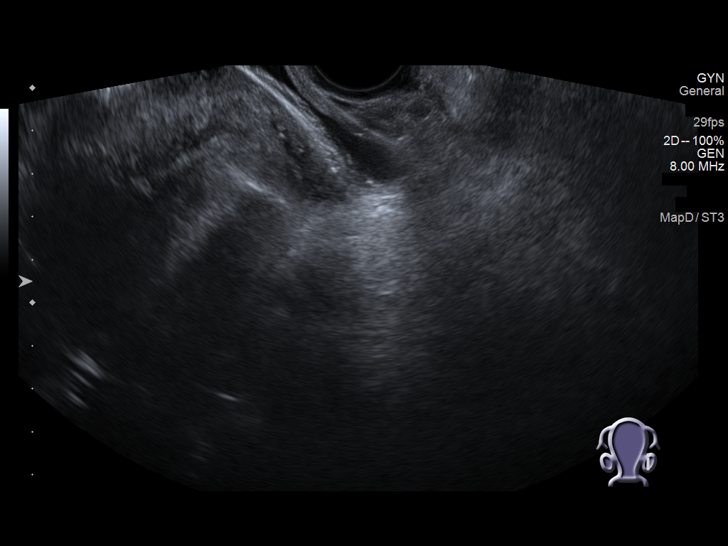

[13 of 25 positions shown; findings below may reference images not displayed]

FINDINGS: Uterus

Measurements: 10.9 x 10.1 x 8.5 cm = volume: 487 mL. Large anterior
wall transmural leiomyoma at upper and mid uterus 6.9 x 6.3 x
cm. Additional posterior wall submucosal leiomyoma at mid uterus
x 2.1 x 3.0 cm.

Endometrium

Thickness: 4 mm.  No endometrial fluid or focal abnormality

Right ovary

Measurements: 3.9 x 2.3 x 2.5 cm = volume: 11.6 mL. Normal
morphology without mass

Left ovary

Measurements: 3.1 x 1.6 x 2.4 cm = volume: 6.2 mL. Normal morphology
without mass

Other findings

No free pelvic fluid.  No adnexal masses.
IMPRESSION: Two uterine leiomyomata, 6.9 cm anterior upper/mid uterus and 3.0 cm
posterior mid uterus, both of which extend submucosal.

The larger leiomyoma corresponds to the mass identified on prior CT,
grossly unchanged.

## 2021-04-19 DIAGNOSIS — G4701 Insomnia due to medical condition: Secondary | ICD-10-CM | POA: Insufficient documentation

## 2021-07-31 DIAGNOSIS — N390 Urinary tract infection, site not specified: Secondary | ICD-10-CM | POA: Insufficient documentation

## 2021-08-14 DIAGNOSIS — K047 Periapical abscess without sinus: Secondary | ICD-10-CM | POA: Insufficient documentation

## 2022-03-25 DIAGNOSIS — F4381 Prolonged grief disorder: Secondary | ICD-10-CM | POA: Insufficient documentation

## 2022-03-25 DIAGNOSIS — E785 Hyperlipidemia, unspecified: Secondary | ICD-10-CM | POA: Diagnosis present

## 2022-11-03 DIAGNOSIS — E039 Hypothyroidism, unspecified: Secondary | ICD-10-CM | POA: Diagnosis present

## 2022-11-03 DIAGNOSIS — J309 Allergic rhinitis, unspecified: Secondary | ICD-10-CM | POA: Insufficient documentation

## 2022-11-03 DIAGNOSIS — F418 Other specified anxiety disorders: Secondary | ICD-10-CM | POA: Insufficient documentation

## 2023-01-27 DIAGNOSIS — N939 Abnormal uterine and vaginal bleeding, unspecified: Secondary | ICD-10-CM | POA: Insufficient documentation

## 2023-03-17 ENCOUNTER — Telehealth: Payer: Self-pay

## 2023-03-17 NOTE — Telephone Encounter (Signed)
Attempted call for follow up of [facility] client for wellness. Interpreter services utilized. No answer, left message to please return call.  Next appointment at [facility] scheduled for 04/2023.  Will continue to attempt to reach out to client.   Francee Nodal RN [employer]

## 2023-06-24 ENCOUNTER — Other Ambulatory Visit: Payer: Self-pay

## 2023-06-24 ENCOUNTER — Emergency Department (HOSPITAL_COMMUNITY)
Admission: EM | Admit: 2023-06-24 | Discharge: 2023-06-24 | Disposition: A | Discharge status: Home / Self Care | Payer: Self-pay | Attending: Emergency Medicine | Admitting: Emergency Medicine

## 2023-06-24 ENCOUNTER — Emergency Department (HOSPITAL_COMMUNITY): Payer: Self-pay

## 2023-06-24 DIAGNOSIS — W19XXXA Unspecified fall, initial encounter: Secondary | ICD-10-CM | POA: Insufficient documentation

## 2023-06-24 DIAGNOSIS — S8392XA Sprain of unspecified site of left knee, initial encounter: Secondary | ICD-10-CM | POA: Insufficient documentation

## 2023-06-24 MED ORDER — NAPROXEN 375 MG PO TABS: 375.0000 mg | ORAL_TABLET | Freq: Two times a day (BID) | ORAL | 0 refills | Status: DC

## 2023-06-24 MED ORDER — ACETAMINOPHEN 325 MG PO TABS
650.0000 mg | ORAL_TABLET | Freq: Once | ORAL | Status: AC
  Administered 2023-06-24: 650 mg via ORAL
  Filled 2023-06-24: qty 2

## 2023-06-24 MED ORDER — IBUPROFEN 400 MG PO TABS
400.0000 mg | ORAL_TABLET | Freq: Once | ORAL | Status: AC
  Administered 2023-06-24: 400 mg via ORAL
  Filled 2023-06-24: qty 1

## 2023-06-24 NOTE — ED Provider Notes (Signed)
[place] EMERGENCY DEPARTMENT AT [facility] Provider Note   CSN: [id number] Arrival date & time: 06/24/23  2006     History  Chief Complaint  Patient presents with   Fall    10 days ago    Rhonda Hawkins is a 47 y.o. female.   Fall     Patient presents ED with complaints of left knee pain.  Patient states she fell about 10 days ago.  She has been trying to manage herself at home but unfortunately pain has persisted.  The pain is in her left knee but does go up towards her thigh.  It hurts for her to walk.  Home Medications Prior to Admission medications   Medication Sig Start Date End Date Taking? Authorizing Provider  naproxen (NAPROSYN) 375 MG tablet Take 1 tablet (375 mg total) by mouth 2 (two) times daily. 06/24/23  Yes Linwood Dibbles, MD  Cholecalciferol 1.25 MG (50000 UT) TABS  04/17/20   [provider]  docusate sodium (COLACE) 100 MG capsule Take 1 capsule (100 mg total) by mouth 2 (two) times daily as needed. 03/03/20   Tilda Burrow, MD  fenofibrate (TRICOR) 145 MG tablet Take 145 mg by mouth daily.    [provider]  ferrous sulfate 325 (65 FE) MG tablet Take 325 mg by mouth every evening.    [provider]  glipiZIDE (GLUCOTROL) 10 MG tablet Take 20 mg by mouth 2 (two) times daily before a meal.     [provider]  ibuprofen (ADVIL) 200 MG tablet Take 200 mg by mouth 3 (three) times daily as needed (pain.).    [provider]  ibuprofen (ADVIL) 800 MG tablet Take 1 tablet (800 mg total) by mouth every 8 (eight) hours as needed. 03/15/20   Lazaro Arms, MD  ondansetron (ZOFRAN) 4 MG tablet Take 1 tablet (4 mg total) by mouth every 6 (six) hours as needed for nausea. 03/03/20   Tilda Burrow, MD  potassium chloride SA (KLOR-CON) 20 MEQ tablet Take 2 tablets (40 mEq total) by mouth every evening. 03/04/20   Tilda Burrow, MD  traMADol (ULTRAM) 50 MG tablet Take 1 tablet (50 mg total) by mouth  every 6 (six) hours as needed. 03/15/20   Lazaro Arms, MD      Allergies    Patient has no known allergies.    Review of Systems   Review of Systems  Physical Exam Updated Vital Signs BP 127/76 (BP Location: Right Arm)   Pulse (!) 112   Temp 98.5 F (36.9 C) (Oral)   Resp 14   Ht 1.499 m (4\' 11" )   Wt 52.2 kg   LMP 02/02/2020   SpO2 100%   BMI 23.23 kg/m  Physical Exam Vitals and nursing note reviewed.  Constitutional:      General: She is not in acute distress.    Appearance: She is well-developed.  HENT:     Head: Normocephalic and atraumatic.     Right Ear: External ear normal.     Left Ear: External ear normal.  Eyes:     General: No scleral icterus.       Right eye: No discharge.        Left eye: No discharge.     Conjunctiva/sclera: Conjunctivae normal.  Neck:     Trachea: No tracheal deviation.  Cardiovascular:     Rate and Rhythm: Normal rate.  Pulmonary:     Effort: Pulmonary effort  is normal. No respiratory distress.     Breath sounds: No stridor.  Abdominal:     General: There is no distension.  Musculoskeletal:        General: Tenderness present. No swelling or deformity.     Cervical back: Neck supple.     Comments: No edema or effusion or deformity noted, tenderness palpation left knee as well as left thigh, normal pulses and perfusion distally  Skin:    General: Skin is warm and dry.     Findings: No rash.  Neurological:     Mental Status: She is alert. Mental status is at baseline.     Cranial Nerves: No dysarthria or facial asymmetry.     Motor: No seizure activity.     ED Results / Procedures / Treatments   Labs (all labs ordered are listed, but only abnormal results are displayed) Labs Reviewed - No data to display  EKG None  Radiology DG Knee Complete 4 Views Left  Result Date: 06/24/2023 CLINICAL DATA:  Larey Seat 10 days ago with persistent left hip and knee pain. EXAM: LEFT KNEE - COMPLETE 4+ VIEW; LEFT FEMUR 2 VIEWS COMPARISON:   None Available. FINDINGS: Four views of the left knee and AP and lateral views of the left femur. Left femur and left knee appear intact. No evidence of acute fracture or subluxation. No focal bone lesion or bone destruction. Bone cortex and trabecular architecture appear intact. No radiopaque soft tissue foreign bodies. IMPRESSION: No acute bony abnormalities. Electronically Signed   By: Burman Nieves M.D.   On: 06/24/2023 22:25   DG Femur Min 2 Views Left  Result Date: 06/24/2023 CLINICAL DATA:  Larey Seat 10 days ago with persistent left hip and knee pain. EXAM: LEFT KNEE - COMPLETE 4+ VIEW; LEFT FEMUR 2 VIEWS COMPARISON:  None Available. FINDINGS: Four views of the left knee and AP and lateral views of the left femur. Left femur and left knee appear intact. No evidence of acute fracture or subluxation. No focal bone lesion or bone destruction. Bone cortex and trabecular architecture appear intact. No radiopaque soft tissue foreign bodies. IMPRESSION: No acute bony abnormalities. Electronically Signed   By: Burman Nieves M.D.   On: 06/24/2023 22:25    Procedures Procedures    Medications Ordered in ED Medications  ibuprofen (ADVIL) tablet 400 mg (400 mg Oral Given 06/24/23 2116)    ED Course/ Medical Decision Making/ A&P Clinical Course as of 06/24/23 2247  Wed Jun 24, 2023  2238 X-rays of femur and knee without acute fracture [JK]    Clinical Course User Index [JK] Linwood Dibbles, MD                                 Medical Decision Making Problems Addressed: Sprain of left knee, unspecified ligament, initial encounter: acute illness or injury that poses a threat to life or bodily functions  Amount and/or Complexity of Data Reviewed Radiology: ordered and independent interpretation performed.  Risk Prescription drug management.   Patient's exam is reassuring.  No focal swelling or erythema.  No signs to suggest infection.  No signs to suggest vascular compromise.  X-rays do not  show any signs of fracture or dislocation.  Suspect knee sprain muscle strain.  Will discharge home with NSAIDs crutches for support outpatient follow-up with orthopedics        Final Clinical Impression(s) / ED Diagnoses Final diagnoses:  Sprain of left  knee, unspecified ligament, initial encounter    Rx / DC Orders ED Discharge Orders          Ordered    naproxen (NAPROSYN) 375 MG tablet  2 times daily        06/24/23 2246              Linwood Dibbles, MD 06/24/23 2248

## 2023-06-24 NOTE — ED Triage Notes (Signed)
Pt to Ed with son, pt reports falling 10 days ago injuring left knee, pt ambulatory with limp.

## 2023-06-24 NOTE — Discharge Instructions (Signed)
Take the medications as prescribed to help with the pain and discomfort.  Use the crutches to help take the weight off your leg.  Follow-up with an orthopedic doctor to be rechecked if the symptoms persist

## 2023-06-29 ENCOUNTER — Telehealth: Payer: Self-pay

## 2023-06-29 NOTE — Telephone Encounter (Signed)
1st attempt to follow up with [facility] client after recent ER visit. Interpreter services used. No answer, left Voicemail requesting return call.  Last seen at [facility] on 06/02/23 and next appointment scheduled for 08/03/23.   Will attempt again on 06/30/23  Francee Nodal RN [employer]/[facility]

## 2023-08-04 ENCOUNTER — Telehealth: Payer: Self-pay

## 2023-08-04 NOTE — Telephone Encounter (Signed)
 With interpreter services attempted follow up call to [facility] for follow up with Diabetes and SDOH needs. No answer, No answer left, [facility]  Will follow for updated A1C and attempt to follow up at that time again.   Avelina JONELLE Skeen RN [employer]

## 2023-12-21 ENCOUNTER — Telehealth: Payer: Self-pay

## 2023-12-21 NOTE — Telephone Encounter (Signed)
 Attempted follow up call to [facility] client with interpreter. No answer. Left message requesting return call.   Kris Pester RN [employer]

## 2024-04-04 ENCOUNTER — Other Ambulatory Visit (HOSPITAL_COMMUNITY): Payer: Self-pay | Admitting: Physician Assistant

## 2024-04-04 ENCOUNTER — Telehealth: Payer: Self-pay

## 2024-04-04 DIAGNOSIS — J069 Acute upper respiratory infection, unspecified: Secondary | ICD-10-CM | POA: Insufficient documentation

## 2024-04-04 DIAGNOSIS — M25512 Pain in left shoulder: Secondary | ICD-10-CM

## 2024-04-04 DIAGNOSIS — M25522 Pain in left elbow: Secondary | ICD-10-CM

## 2024-04-04 NOTE — Telephone Encounter (Signed)
 Attempted follow up call to [facility] client with interpreter services. No answer, left message. Will continue to attempt to reach out to client.  Last Visit today 04/04/24  A1C increased to 12.7 today reports URI and missing medications at times.  Next visit scheduled for not showing currently. Will monitor.   Avelina JONELLE Skeen RN [employer]

## 2024-04-05 ENCOUNTER — Ambulatory Visit (HOSPITAL_COMMUNITY)
Admission: RE | Admit: 2024-04-05 | Discharge: 2024-04-05 | Disposition: A | Discharge status: Home / Self Care | Payer: Self-pay | Source: Ambulatory Visit | Attending: Physician Assistant | Admitting: Physician Assistant

## 2024-04-05 DIAGNOSIS — M25512 Pain in left shoulder: Secondary | ICD-10-CM | POA: Insufficient documentation

## 2024-04-05 DIAGNOSIS — M25522 Pain in left elbow: Secondary | ICD-10-CM | POA: Insufficient documentation

## 2024-06-17 ENCOUNTER — Emergency Department (HOSPITAL_COMMUNITY): Payer: Self-pay

## 2024-06-17 ENCOUNTER — Encounter (HOSPITAL_COMMUNITY): Payer: Self-pay

## 2024-06-17 ENCOUNTER — Inpatient Hospital Stay (HOSPITAL_COMMUNITY): Payer: Self-pay

## 2024-06-17 ENCOUNTER — Emergency Department (HOSPITAL_COMMUNITY): Payer: Self-pay | Admitting: Anesthesiology

## 2024-06-17 ENCOUNTER — Inpatient Hospital Stay: Admit: 2024-06-17 | Payer: Self-pay | Admitting: Urology

## 2024-06-17 ENCOUNTER — Other Ambulatory Visit: Payer: Self-pay

## 2024-06-17 ENCOUNTER — Encounter (HOSPITAL_COMMUNITY)
Admission: EM | Disposition: A | Discharge status: Home / Self Care | Payer: Self-pay | Source: Other Acute Inpatient Hospital | Attending: Internal Medicine

## 2024-06-17 ENCOUNTER — Inpatient Hospital Stay (HOSPITAL_COMMUNITY)
Admission: EM | Admit: 2024-06-17 | Discharge: 2024-06-19 | DRG: 660 | Disposition: A | Discharge status: Home / Self Care | Payer: MEDICAID | Attending: Internal Medicine | Admitting: Internal Medicine

## 2024-06-17 DIAGNOSIS — N12 Tubulo-interstitial nephritis, not specified as acute or chronic: Secondary | ICD-10-CM | POA: Diagnosis present

## 2024-06-17 DIAGNOSIS — E119 Type 2 diabetes mellitus without complications: Secondary | ICD-10-CM

## 2024-06-17 DIAGNOSIS — N209 Urinary calculus, unspecified: Secondary | ICD-10-CM | POA: Diagnosis present

## 2024-06-17 DIAGNOSIS — N138 Other obstructive and reflux uropathy: Secondary | ICD-10-CM | POA: Diagnosis present

## 2024-06-17 DIAGNOSIS — Z79899 Other long term (current) drug therapy: Secondary | ICD-10-CM

## 2024-06-17 DIAGNOSIS — Z5982 Transportation insecurity: Secondary | ICD-10-CM

## 2024-06-17 DIAGNOSIS — E1165 Type 2 diabetes mellitus with hyperglycemia: Secondary | ICD-10-CM | POA: Diagnosis present

## 2024-06-17 DIAGNOSIS — B9620 Unspecified Escherichia coli [E. coli] as the cause of diseases classified elsewhere: Secondary | ICD-10-CM | POA: Diagnosis present

## 2024-06-17 DIAGNOSIS — F418 Other specified anxiety disorders: Secondary | ICD-10-CM

## 2024-06-17 DIAGNOSIS — Z9851 Tubal ligation status: Secondary | ICD-10-CM

## 2024-06-17 DIAGNOSIS — E039 Hypothyroidism, unspecified: Secondary | ICD-10-CM | POA: Diagnosis present

## 2024-06-17 DIAGNOSIS — R7989 Other specified abnormal findings of blood chemistry: Secondary | ICD-10-CM | POA: Diagnosis present

## 2024-06-17 DIAGNOSIS — N201 Calculus of ureter: Secondary | ICD-10-CM

## 2024-06-17 DIAGNOSIS — Z888 Allergy status to other drugs, medicaments and biological substances status: Secondary | ICD-10-CM

## 2024-06-17 DIAGNOSIS — R748 Abnormal levels of other serum enzymes: Secondary | ICD-10-CM | POA: Diagnosis present

## 2024-06-17 DIAGNOSIS — N139 Obstructive and reflux uropathy, unspecified: Secondary | ICD-10-CM

## 2024-06-17 DIAGNOSIS — E785 Hyperlipidemia, unspecified: Secondary | ICD-10-CM | POA: Diagnosis present

## 2024-06-17 DIAGNOSIS — Z90710 Acquired absence of both cervix and uterus: Secondary | ICD-10-CM

## 2024-06-17 DIAGNOSIS — N136 Pyonephrosis: Principal | ICD-10-CM | POA: Diagnosis present

## 2024-06-17 DIAGNOSIS — Z7984 Long term (current) use of oral hypoglycemic drugs: Secondary | ICD-10-CM

## 2024-06-17 DIAGNOSIS — Z59868 Other specified financial insecurity: Secondary | ICD-10-CM

## 2024-06-17 DIAGNOSIS — N1330 Unspecified hydronephrosis: Secondary | ICD-10-CM | POA: Diagnosis present

## 2024-06-17 DIAGNOSIS — Z5971 Insufficient health insurance coverage: Secondary | ICD-10-CM

## 2024-06-17 DIAGNOSIS — Z603 Acculturation difficulty: Secondary | ICD-10-CM | POA: Diagnosis present

## 2024-06-17 DIAGNOSIS — R739 Hyperglycemia, unspecified: Secondary | ICD-10-CM

## 2024-06-17 HISTORY — PX: CYSTOSCOPY W/ URETERAL STENT PLACEMENT: SHX1429

## 2024-06-17 LAB — COMPREHENSIVE METABOLIC PANEL WITH GFR
ALT: 27 U/L (ref 0–44)
AST: 16 U/L (ref 15–41)
Albumin: 3.7 g/dL (ref 3.5–5.0)
Alkaline Phosphatase: 88 U/L (ref 38–126)
Anion gap: 13 (ref 5–15)
BUN: 11 mg/dL (ref 6–20)
CO2: 21 mmol/L — ABNORMAL LOW (ref 22–32)
Calcium: 8.9 mg/dL (ref 8.9–10.3)
Chloride: 94 mmol/L — ABNORMAL LOW (ref 98–111)
Creatinine, Ser: 0.69 mg/dL (ref 0.44–1.00)
GFR, Estimated: >60 mL/min (ref >60)
Glucose, Bld: 514 mg/dL (ref 70–99)
Potassium: 4.1 mmol/L (ref 3.5–5.1)
Sodium: 128 mmol/L — ABNORMAL LOW (ref 135–145)
Total Bilirubin: 1 mg/dL (ref 0.0–1.2)
Total Protein: 7.6 g/dL (ref 6.5–8.1)

## 2024-06-17 LAB — CBC WITH DIFFERENTIAL/PLATELET
Abs Immature Granulocytes: 0.03 K/uL (ref 0.00–0.07)
Basophils Absolute: 0 K/uL (ref 0.0–0.1)
Basophils Relative: 0 %
Eosinophils Absolute: 0 K/uL (ref 0.0–0.5)
Eosinophils Relative: 0 %
HCT: 37.8 % (ref 36.0–46.0)
Hemoglobin: 13.1 g/dL (ref 12.0–15.0)
Immature Granulocytes: 0 %
Lymphocytes Relative: 18 %
Lymphs Abs: 1.8 K/uL (ref 0.7–4.0)
MCH: 29.8 pg (ref 26.0–34.0)
MCHC: 34.7 g/dL (ref 30.0–36.0)
MCV: 85.9 fL (ref 80.0–100.0)
Monocytes Absolute: 0.9 K/uL (ref 0.1–1.0)
Monocytes Relative: 9 %
Neutro Abs: 6.9 K/uL (ref 1.7–7.7)
Neutrophils Relative %: 73 %
Platelets: 151 K/uL (ref 150–400)
RBC: 4.4 MIL/uL (ref 3.87–5.11)
RDW: 11.3 % — ABNORMAL LOW (ref 11.5–15.5)
WBC: 9.6 K/uL (ref 4.0–10.5)
nRBC: 0 % (ref 0.0–0.2)

## 2024-06-17 LAB — URINALYSIS, ROUTINE W REFLEX MICROSCOPIC
Bilirubin Urine: NEGATIVE
Glucose, UA: >=500 mg/dL — AB
Hgb urine dipstick: NEGATIVE
Ketones, ur: 20 mg/dL — AB
Nitrite: NEGATIVE
Protein, ur: NEGATIVE mg/dL
Specific Gravity, Urine: >1.046 — ABNORMAL HIGH (ref 1.005–1.030)
WBC, UA: >50 WBC/hpf (ref 0–5)
pH: 5 (ref 5.0–8.0)

## 2024-06-17 LAB — LIPASE, BLOOD: Lipase: 90 U/L — ABNORMAL HIGH (ref 11–51)

## 2024-06-17 LAB — GLUCOSE, CAPILLARY
Glucose-Capillary: 375 mg/dL — ABNORMAL HIGH (ref 70–99)
Glucose-Capillary: 275 mg/dL — ABNORMAL HIGH (ref 70–99)
Glucose-Capillary: 106 mg/dL — ABNORMAL HIGH (ref 70–99)
Glucose-Capillary: 199 mg/dL — ABNORMAL HIGH (ref 70–99)

## 2024-06-17 LAB — LACTIC ACID, PLASMA: Lactic Acid, Venous: 1.2 mmol/L (ref 0.5–1.9)

## 2024-06-17 LAB — CBG MONITORING, ED: Glucose-Capillary: 346 mg/dL — ABNORMAL HIGH (ref 70–99)

## 2024-06-17 SURGERY — CYSTOSCOPY, WITH RETROGRADE PYELOGRAM AND URETERAL STENT INSERTION
Anesthesia: General | Site: Ureter | Laterality: Right

## 2024-06-17 MED ORDER — SCOPOLAMINE 1 MG/3DAYS TD PT72
1.0000 | MEDICATED_PATCH | TRANSDERMAL | Status: DC
  Administered 2024-06-17: 1 mg via TRANSDERMAL

## 2024-06-17 MED ORDER — ONDANSETRON HCL 4 MG PO TABS
4.0000 mg | ORAL_TABLET | Freq: Four times a day (QID) | ORAL | Status: DC | PRN
  Administered 2024-06-17 – 2024-06-18 (×3): 4 mg via ORAL
  Filled 2024-06-17 (×3): qty 1

## 2024-06-17 MED ORDER — SODIUM CHLORIDE 0.45 % IV SOLN: INTRAVENOUS | Status: AC

## 2024-06-17 MED ORDER — LACTATED RINGERS IV SOLN: INTRAVENOUS | Status: DC

## 2024-06-17 MED ORDER — HYDROMORPHONE HCL 1 MG/ML IJ SOLN: 0.2500 mg | INTRAMUSCULAR | Status: DC | PRN

## 2024-06-17 MED ORDER — LACTATED RINGERS IV BOLUS
1000.0000 mL | Freq: Once | INTRAVENOUS | Status: AC
  Administered 2024-06-17: 1000 mL via INTRAVENOUS

## 2024-06-17 MED ORDER — PROPOFOL 10 MG/ML IV BOLUS
INTRAVENOUS | Status: DC | PRN
  Administered 2024-06-17: 150 mg via INTRAVENOUS

## 2024-06-17 MED ORDER — INSULIN ASPART 100 UNIT/ML IJ SOLN
0.0000 [IU] | INTRAMUSCULAR | Status: DC | PRN
  Administered 2024-06-17: 12 [IU] via SUBCUTANEOUS
  Filled 2024-06-17: qty 13

## 2024-06-17 MED ORDER — 0.9 % SODIUM CHLORIDE (POUR BTL) OPTIME
TOPICAL | Status: DC | PRN
  Administered 2024-06-17: 1000 mL

## 2024-06-17 MED ORDER — PROPOFOL 10 MG/ML IV BOLUS
INTRAVENOUS | Status: AC
  Filled 2024-06-17: qty 20

## 2024-06-17 MED ORDER — MEPERIDINE HCL 25 MG/ML IJ SOLN: 6.2500 mg | INTRAMUSCULAR | Status: DC | PRN

## 2024-06-17 MED ORDER — FENTANYL CITRATE (PF) 100 MCG/2ML IJ SOLN
INTRAMUSCULAR | Status: AC
  Filled 2024-06-17: qty 2

## 2024-06-17 MED ORDER — PHENYLEPHRINE HCL (PRESSORS) 10 MG/ML IV SOLN
INTRAVENOUS | Status: DC | PRN
  Administered 2024-06-17 (×4): 160 ug via INTRAVENOUS

## 2024-06-17 MED ORDER — ONDANSETRON HCL 4 MG/2ML IJ SOLN
4.0000 mg | Freq: Once | INTRAMUSCULAR | Status: AC
  Administered 2024-06-17: 4 mg via INTRAVENOUS
  Filled 2024-06-17: qty 2

## 2024-06-17 MED ORDER — CHLORHEXIDINE GLUCONATE 0.12 % MT SOLN
15.0000 mL | Freq: Once | OROMUCOSAL | Status: AC
  Administered 2024-06-17: 15 mL via OROMUCOSAL

## 2024-06-17 MED ORDER — AMISULPRIDE (ANTIEMETIC) 5 MG/2ML IV SOLN
10.0000 mg | Freq: Once | INTRAVENOUS | Status: DC | PRN
Start: 2024-06-17 — End: 2024-06-17

## 2024-06-17 MED ORDER — INSULIN ASPART 100 UNIT/ML IJ SOLN
INTRAMUSCULAR | Status: AC
  Filled 2024-06-17: qty 8

## 2024-06-17 MED ORDER — IOHEXOL 300 MG/ML  SOLN
100.0000 mL | Freq: Once | INTRAMUSCULAR | Status: AC | PRN
  Administered 2024-06-17: 100 mL via INTRAVENOUS

## 2024-06-17 MED ORDER — MORPHINE SULFATE (PF) 2 MG/ML IV SOLN
1.0000 mg | Freq: Once | INTRAVENOUS | Status: AC
  Administered 2024-06-17: 1 mg via INTRAVENOUS
  Filled 2024-06-17: qty 1

## 2024-06-17 MED ORDER — MIDAZOLAM HCL 5 MG/5ML IJ SOLN
INTRAMUSCULAR | Status: DC | PRN
  Administered 2024-06-17: 2 mg via INTRAVENOUS

## 2024-06-17 MED ORDER — PROPOFOL 10 MG/ML IV BOLUS
INTRAVENOUS | Status: AC
Start: 2024-06-17 — End: 2024-06-17
  Filled 2024-06-17: qty 20

## 2024-06-17 MED ORDER — KETOROLAC TROMETHAMINE 15 MG/ML IJ SOLN: 15.0000 mg | Freq: Once | INTRAMUSCULAR | Status: DC

## 2024-06-17 MED ORDER — IOHEXOL 300 MG/ML  SOLN
INTRAMUSCULAR | Status: DC | PRN
Start: 2024-06-17 — End: 2024-06-17
  Administered 2024-06-17: 3 mL

## 2024-06-17 MED ORDER — ACETAMINOPHEN 325 MG PO TABS
650.0000 mg | ORAL_TABLET | Freq: Four times a day (QID) | ORAL | Status: DC | PRN
  Administered 2024-06-17 – 2024-06-19 (×3): 650 mg via ORAL
  Filled 2024-06-17 (×3): qty 2

## 2024-06-17 MED ORDER — FENTANYL CITRATE (PF) 100 MCG/2ML IJ SOLN
50.0000 ug | Freq: Once | INTRAMUSCULAR | Status: AC
  Administered 2024-06-17: 50 ug via INTRAVENOUS
  Filled 2024-06-17: qty 2

## 2024-06-17 MED ORDER — OXYCODONE HCL 5 MG PO TABS: 5.0000 mg | ORAL_TABLET | Freq: Once | ORAL | Status: DC | PRN

## 2024-06-17 MED ORDER — INSULIN ASPART 100 UNIT/ML IJ SOLN
0.0000 [IU] | Freq: Three times a day (TID) | INTRAMUSCULAR | Status: DC
  Administered 2024-06-18: 2 [IU] via SUBCUTANEOUS
  Administered 2024-06-18: 5 [IU] via SUBCUTANEOUS
  Administered 2024-06-18: 2 [IU] via SUBCUTANEOUS
  Administered 2024-06-19: 5 [IU] via SUBCUTANEOUS
  Administered 2024-06-19: 7 [IU] via SUBCUTANEOUS
  Filled 2024-06-17: qty 5
  Filled 2024-06-17 (×2): qty 2
  Filled 2024-06-17: qty 7
  Filled 2024-06-17: qty 5

## 2024-06-17 MED ORDER — OXYCODONE HCL 5 MG/5ML PO SOLN: 5.0000 mg | Freq: Once | ORAL | Status: DC | PRN

## 2024-06-17 MED ORDER — ACETAMINOPHEN 500 MG PO TABS
1000.0000 mg | ORAL_TABLET | Freq: Once | ORAL | Status: AC
  Administered 2024-06-17: 1000 mg via ORAL

## 2024-06-17 MED ORDER — OXYCODONE HCL 5 MG PO TABS
5.0000 mg | ORAL_TABLET | Freq: Four times a day (QID) | ORAL | Status: DC | PRN
  Administered 2024-06-17 – 2024-06-19 (×7): 5 mg via ORAL
  Filled 2024-06-17 (×7): qty 1

## 2024-06-17 MED ORDER — SODIUM CHLORIDE 0.9 % IV BOLUS
1000.0000 mL | Freq: Once | INTRAVENOUS | Status: AC
  Administered 2024-06-17: 1000 mL via INTRAVENOUS

## 2024-06-17 MED ORDER — ONDANSETRON HCL 4 MG/2ML IJ SOLN: 4.0000 mg | Freq: Four times a day (QID) | INTRAMUSCULAR | Status: DC | PRN

## 2024-06-17 MED ORDER — SODIUM CHLORIDE 0.9 % IV SOLN
1.0000 g | INTRAVENOUS | Status: DC
  Administered 2024-06-18 – 2024-06-19 (×2): 1 g via INTRAVENOUS
  Filled 2024-06-17 (×2): qty 10

## 2024-06-17 MED ORDER — STERILE WATER FOR IRRIGATION IR SOLN
Status: DC | PRN
  Administered 2024-06-17: 3000 mL

## 2024-06-17 MED ORDER — ONDANSETRON HCL 4 MG/2ML IJ SOLN: 4.0000 mg | Freq: Once | INTRAMUSCULAR | Status: DC | PRN

## 2024-06-17 MED ORDER — HYDROMORPHONE HCL 1 MG/ML IJ SOLN
0.5000 mg | Freq: Once | INTRAMUSCULAR | Status: AC
  Administered 2024-06-17: 0.5 mg via INTRAVENOUS
  Filled 2024-06-17: qty 0.5

## 2024-06-17 MED ORDER — SCOPOLAMINE 1 MG/3DAYS TD PT72
MEDICATED_PATCH | TRANSDERMAL | Status: AC
  Filled 2024-06-17: qty 1

## 2024-06-17 MED ORDER — FENTANYL CITRATE (PF) 100 MCG/2ML IJ SOLN
INTRAMUSCULAR | Status: DC | PRN
  Administered 2024-06-17 (×4): 50 ug via INTRAVENOUS

## 2024-06-17 MED ORDER — SODIUM CHLORIDE 0.9 % IV SOLN
1.0000 g | Freq: Once | INTRAVENOUS | Status: AC
  Administered 2024-06-17: 1 g via INTRAVENOUS
  Filled 2024-06-17: qty 10

## 2024-06-17 MED ORDER — MIDAZOLAM HCL 2 MG/2ML IJ SOLN
INTRAMUSCULAR | Status: AC
  Filled 2024-06-17: qty 2

## 2024-06-17 MED ORDER — LACTATED RINGERS IV SOLN: INTRAVENOUS | Status: AC

## 2024-06-17 MED ORDER — ACETAMINOPHEN 650 MG RE SUPP: 650.0000 mg | Freq: Four times a day (QID) | RECTAL | Status: DC | PRN

## 2024-06-17 MED ORDER — INSULIN ASPART 100 UNIT/ML IJ SOLN
8.0000 [IU] | Freq: Once | INTRAMUSCULAR | Status: AC
  Administered 2024-06-17: 8 [IU] via SUBCUTANEOUS

## 2024-06-17 MED ORDER — HYDROMORPHONE HCL 1 MG/ML IJ SOLN: 0.5000 mg | Freq: Once | INTRAMUSCULAR | Status: DC

## 2024-06-17 MED ORDER — ONDANSETRON HCL 4 MG/2ML IJ SOLN
INTRAMUSCULAR | Status: DC | PRN
Start: 2024-06-17 — End: 2024-06-17
  Administered 2024-06-17: 4 mg via INTRAVENOUS

## 2024-06-17 MED ORDER — KETOROLAC TROMETHAMINE 15 MG/ML IJ SOLN
15.0000 mg | Freq: Once | INTRAMUSCULAR | Status: AC
  Administered 2024-06-17: 15 mg via INTRAVENOUS
  Filled 2024-06-17: qty 1

## 2024-06-17 MED ORDER — LIDOCAINE HCL (CARDIAC) PF 100 MG/5ML IV SOSY
PREFILLED_SYRINGE | INTRAVENOUS | Status: DC | PRN
  Administered 2024-06-17: 60 mg via INTRAVENOUS

## 2024-06-17 SURGICAL SUPPLY — 11 items
BAG URO CATCHER STRL LF (MISCELLANEOUS) ×1 IMPLANT
CATH URETL OPEN 5X70 (CATHETERS) IMPLANT
CLOTH BEACON ORANGE TIMEOUT ST (SAFETY) ×1 IMPLANT
GLOVE SURG SS PI 8.0 STRL IVOR (GLOVE) IMPLANT
GOWN STRL SURGICAL XL XLNG (GOWN DISPOSABLE) ×1 IMPLANT
GUIDEWIRE STR DUAL SENSOR (WIRE) ×1 IMPLANT
KIT TURNOVER KIT A (KITS) ×1 IMPLANT
MANIFOLD NEPTUNE II (INSTRUMENTS) ×1 IMPLANT
PACK CYSTO (CUSTOM PROCEDURE TRAY) ×1 IMPLANT
STENT URET 6FRX24 CONTOUR (STENTS) IMPLANT
TUBING CONNECTING 10 (TUBING) ×1 IMPLANT

## 2024-06-17 NOTE — Discharge Instructions (Signed)
 The stent was placed today.  You will need the ureteroscopy to remove the stone in 2-3 weeks.

## 2024-06-17 NOTE — Consult Note (Signed)
 Subjective: 1. Right ureteral stone   2. Hyperglycemia      Consult requested by Dr. Nancyann Kin  48 yo female who presented to the [facility] ER with right flank pain, chills and N/V.  Poor pain control.  UA is consistent with UTI.  Diabetic with glucose > 500.  CT 2mm right proximal stone with obstruction and small renal stones.    ROS:  Review of Systems  Constitutional:  Positive for chills.  Gastrointestinal:  Positive for nausea and vomiting.  Genitourinary:  Positive for flank pain.  All other systems reviewed and are negative.   Allergies  Allergen Reactions   Metformin  Diarrhea    Past Medical History:  Diagnosis Date   Diabetes mellitus without complication (HCC)     Past Surgical History:  Procedure Laterality Date   DILATION AND CURETTAGE OF UTERUS     LAPAROTOMY  02/07/2011   Procedure: LAPAROTOMY;  Surgeon: Norleen LULLA Server, MD;  Location: [facility];  Service: Gynecology;  Laterality: N/A;   SUPRACERVICAL ABDOMINAL HYSTERECTOMY N/A 03/02/2020   Procedure: HYSTERECTOMY SUPRACERVICAL ABDOMINAL;  Surgeon: Server Norleen LULLA, MD;  Location: [facility];  Service: Gynecology;  Laterality: N/A;   TUBAL LIGATION  02/07/2011   Procedure: BILATERAL TUBAL LIGATION;  Surgeon: Norleen LULLA Server, MD;  Location: [facility];  Service: Gynecology;  Laterality: Bilateral;  Bilateral salpingectomy    Social History   Socioeconomic History   Marital status: Single    Spouse name: Not on file   Number of children: Not on file   Years of education: Not on file   Highest education level: Not on file  Occupational History   Not on file  Tobacco Use   Smoking status: Never   Smokeless tobacco: Never  Vaping Use   Vaping status: Never Used  Substance and Sexual Activity   Alcohol use: No   Drug use: No   Sexual activity: Not Currently    Birth control/protection: Surgical    Comment: [facility]  Other Topics Concern   Not on file  Social History Narrative   Not on file   Social Drivers of  Health   Financial Resource Strain: High Risk (01/02/2020)   Overall Financial Resource Strain (CARDIA)    Difficulty of Paying Living Expenses: Very hard  Food Insecurity: Food Insecurity Present (01/02/2020)   Hunger Vital Sign    Worried About Running Out of Food in the Last Year: Sometimes true    Ran Out of Food in the Last Year: Sometimes true  Transportation Needs: Unmet Transportation Needs (01/02/2020)   PRAPARE - Transportation    Lack of Transportation (Medical): Yes    Lack of Transportation (Non-Medical): Yes  Physical Activity: Insufficiently Active (01/02/2020)   Exercise Vital Sign    Days of Exercise per Week: 2 days    Minutes of Exercise per Session: 20 min  Stress: Stress Concern Present (01/02/2020)   [employer] - Occupational Stress Questionnaire    Feeling of Stress : Rather much  Social Connections: Unknown (12/03/2021)   Received from [facility]   Social Network    Social Network: Not on file  Intimate Partner Violence: Unknown (10/24/2021)   Received from [facility]   HITS    Physically Hurt: Not on file    Insult or Talk Down To: Not on file    Threaten Physical Harm: Not on file    Scream or Curse: Not on file    Family History  Problem Relation Age  of Onset   Anesthesia problems Mother        difficulty awakening after anesthesia   Anesthesia problems Sister        nausea and vomiting    Anti-infectives: Anti-infectives (From admission, onward)    Start     Dose/Rate Route Frequency Ordered Stop   06/18/24 0345  [MAR Hold]  cefTRIAXone (ROCEPHIN) 1 g in sodium chloride  0.9 % 100 mL IVPB        (MAR Hold since Fri 06/17/2024 at 1035.Hold Reason: Transfer to a Procedural area)   1 g 200 mL/hr over 30 Minutes Intravenous Every 24 hours 06/17/24 1011     06/17/24 0345  cefTRIAXone (ROCEPHIN) 1 g in sodium chloride  0.9 % 100 mL IVPB        1 g 200 mL/hr over 30 Minutes Intravenous  Once 06/17/24 0334 06/17/24 0454        Current Facility-Administered Medications  Medication Dose Route Frequency Provider Last Rate Last Admin   0.45 % sodium chloride  infusion   Intravenous Continuous Celinda Alm Lot, MD       [facility] Hold] acetaminophen  (TYLENOL ) tablet 650 mg  650 mg Oral Q6H PRN Celinda Alm Lot, MD       Or   [facility] Hold] acetaminophen  (TYLENOL ) suppository 650 mg  650 mg Rectal Q6H PRN Celinda Alm Lot, MD       [facility] Hold] cefTRIAXone (ROCEPHIN) 1 g in sodium chloride  0.9 % 100 mL IVPB  1 g Intravenous Q24H Celinda Alm Lot, MD       [facility] Hold] HYDROmorphone  (DILAUDID ) injection 0.5 mg  0.5 mg Intravenous Once Celinda Alm Lot, MD       insulin  aspart (novoLOG ) injection 0-14 Units  0-14 Units Subcutaneous Q2H PRN Finucane, Elizabeth M, DO   12 Units at 06/17/24 1052   [MAR Hold] ketorolac  (TORADOL ) 15 MG/ML injection 15 mg  15 mg Intravenous Once Celinda Alm Lot, MD       lactated ringers  infusion   Intravenous Continuous Francesca Elsie CROME, MD 75 mL/hr at 06/17/24 9092 Continued from Pre-op at 06/17/24 9092   lactated ringers  infusion   Intravenous Continuous Finucane, Elizabeth M, DO       [MAR Hold] ondansetron  (ZOFRAN ) tablet 4 mg  4 mg Oral Q6H PRN Celinda Alm Lot, MD       Or   [facility] Hold] ondansetron  (ZOFRAN ) injection 4 mg  4 mg Intravenous Q6H PRN Celinda Alm Lot, MD       scopolamine  (TRANSDERM-SCOP) 1 MG/3DAYS 1 mg  1 patch Transdermal Q72H Finucane, Elizabeth M, DO   1 mg at 06/17/24 1039   scopolamine  (TRANSDERM-SCOP) 1 MG/3DAYS              Objective: Vital signs in last 24 hours: BP 117/71 (BP Location: Left Arm)   Pulse (!) 117   Temp 98.7 F (37.1 C) (Oral)   Resp 18   Ht 5' 1 (1.549 m)   Wt 52.6 kg   LMP 02/02/2020   SpO2 96%   BMI 21.92 kg/m   Intake/Output from previous day: 11/27 0701 - 11/28 0700 In: 2100 [IV Piggyback:2100] Out: -  Intake/Output this shift: No intake/output data recorded.   Physical Exam Vitals reviewed.   Constitutional:      Appearance: She is well-developed.  Cardiovascular:     Rate and Rhythm: Regular rhythm. Tachycardia present.     Heart sounds: Normal heart sounds.  Pulmonary:     Effort: Pulmonary effort is  normal. No respiratory distress.  Abdominal:     General: Abdomen is flat.     Tenderness: There is right CVA tenderness.  Neurological:     Mental Status: She is alert.     Lab Results:  Results for orders placed or performed during the hospital encounter of 06/17/24 (from the past 24 hours)  CBC with Differential     Status: Abnormal   Collection Time: 06/17/24  1:50 AM  Result Value Ref Range   WBC 9.6 4.0 - 10.5 K/uL   RBC 4.40 3.87 - 5.11 MIL/uL   Hemoglobin 13.1 12.0 - 15.0 g/dL   HCT 62.1 63.9 - 53.9 %   MCV 85.9 80.0 - 100.0 fL   MCH 29.8 26.0 - 34.0 pg   MCHC 34.7 30.0 - 36.0 g/dL   RDW 88.6 (L) 88.4 - 84.4 %   Platelets 151 150 - 400 K/uL   nRBC 0.0 0.0 - 0.2 %   Neutrophils Relative % 73 %   Neutro Abs 6.9 1.7 - 7.7 K/uL   Lymphocytes Relative 18 %   Lymphs Abs 1.8 0.7 - 4.0 K/uL   Monocytes Relative 9 %   Monocytes Absolute 0.9 0.1 - 1.0 K/uL   Eosinophils Relative 0 %   Eosinophils Absolute 0.0 0.0 - 0.5 K/uL   Basophils Relative 0 %   Basophils Absolute 0.0 0.0 - 0.1 K/uL   Immature Granulocytes 0 %   Abs Immature Granulocytes 0.03 0.00 - 0.07 K/uL  Comprehensive metabolic panel     Status: Abnormal   Collection Time: 06/17/24  1:50 AM  Result Value Ref Range   Sodium 128 (L) 135 - 145 mmol/L   Potassium 4.1 3.5 - 5.1 mmol/L   Chloride 94 (L) 98 - 111 mmol/L   CO2 21 (L) 22 - 32 mmol/L   Glucose, Bld 514 (HH) 70 - 99 mg/dL   BUN 11 6 - 20 mg/dL   Creatinine, Ser 9.30 0.44 - 1.00 mg/dL   Calcium 8.9 8.9 - 89.6 mg/dL   Total Protein 7.6 6.5 - 8.1 g/dL   Albumin 3.7 3.5 - 5.0 g/dL   AST 16 15 - 41 U/L   ALT 27 0 - 44 U/L   Alkaline Phosphatase 88 38 - 126 U/L   Total Bilirubin 1.0 0.0 - 1.2 mg/dL   GFR, Estimated >39 >39 mL/min    Anion gap 13 5 - 15  Lipase, blood     Status: Abnormal   Collection Time: 06/17/24  1:50 AM  Result Value Ref Range   Lipase 90 (H) 11 - 51 U/L  Urinalysis, Routine w reflex microscopic -Urine, Clean Catch     Status: Abnormal   Collection Time: 06/17/24  3:57 AM  Result Value Ref Range   Color, Urine STRAW (A) YELLOW   APPearance CLOUDY (A) CLEAR   Specific Gravity, Urine >1.046 (H) 1.005 - 1.030   pH 5.0 5.0 - 8.0   Glucose, UA >=500 (A) NEGATIVE mg/dL   Hgb urine dipstick NEGATIVE NEGATIVE   Bilirubin Urine NEGATIVE NEGATIVE   Ketones, ur 20 (A) NEGATIVE mg/dL   Protein, ur NEGATIVE NEGATIVE mg/dL   Nitrite NEGATIVE NEGATIVE   Leukocytes,Ua MODERATE (A) NEGATIVE   RBC / HPF 6-10 0 - 5 RBC/hpf   WBC, UA >50 0 - 5 WBC/hpf   Bacteria, UA RARE (A) NONE SEEN   Squamous Epithelial / HPF 0-5 0 - 5 /HPF   WBC Clumps PRESENT   CBG monitoring, ED  Status: Abnormal   Collection Time: 06/17/24  4:59 AM  Result Value Ref Range   Glucose-Capillary 346 (H) 70 - 99 mg/dL  Lactic acid, plasma     Status: None   Collection Time: 06/17/24  8:28 AM  Result Value Ref Range   Lactic Acid, Venous 1.2 0.5 - 1.9 mmol/L  Culture, blood (routine x 2)     Status: None (Preliminary result)   Collection Time: 06/17/24  8:28 AM   Specimen: BLOOD  Result Value Ref Range   Specimen Description BLOOD BLOOD RIGHT FOREARM    Special Requests      BOTTLES DRAWN AEROBIC AND ANAEROBIC Blood Culture adequate volume Performed at [facility], [address] [place], [place] [address]    Culture PENDING    Report Status PENDING   Culture, blood (routine x 2)     Status: None (Preliminary result)   Collection Time: 06/17/24  8:28 AM   Specimen: BLOOD  Result Value Ref Range   Specimen Description BLOOD BLOOD RIGHT HAND    Special Requests      BOTTLES DRAWN AEROBIC AND ANAEROBIC Blood Culture adequate volume Performed at [facility], [address] [place], [place] [address]    Culture PENDING     Report Status PENDING   Glucose, capillary     Status: Abnormal   Collection Time: 06/17/24 10:45 AM  Result Value Ref Range   Glucose-Capillary 375 (H) 70 - 99 mg/dL    BMET Recent Labs    06/17/24 0150  NA 128*  K 4.1  CL 94*  CO2 21*  GLUCOSE 514*  BUN 11  CREATININE 0.69  CALCIUM 8.9   PT/INR No results for input(s): LABPROT, INR in the last 72 hours. ABG No results for input(s): PHART, HCO3 in the last 72 hours.  Invalid input(s): PCO2, PO2  Studies/Results: CT ABDOMEN PELVIS W CONTRAST Result Date: 06/17/2024 EXAM: CT ABDOMEN AND PELVIS WITH CONTRAST 06/17/2024 03:21:31 AM TECHNIQUE: CT of the abdomen and pelvis was performed with the administration of 100 mL of iohexol (OMNIPAQUE) 300 MG/ML solution. Multiplanar reformatted images are provided for review. Automated exposure control, iterative reconstruction, and/or weight-based adjustment of the mA/kV was utilized to reduce the radiation dose to as low as reasonably achievable. COMPARISON: 07/05/2018 CLINICAL HISTORY: RLQ abdominal pain. FINDINGS: LOWER CHEST: No acute abnormality. LIVER: The liver is unremarkable. GALLBLADDER AND BILE DUCTS: Gallbladder is unremarkable. No biliary ductal dilatation. SPLEEN: No acute abnormality. PANCREAS: No acute abnormality. ADRENAL GLANDS: No acute abnormality. KIDNEYS, URETERS AND BLADDER: A few small stones are noted in the proximal right ureter measuring up to 2 mm with mild right hydronephrosis. Below the stones, the right ureter remains mildly prominent and there appears to be enhancement of the ureteral wall. Cannot exclude infection/pyelonephritis. No stones in the left kidney or ureter. No left hydronephrosis. No perinephric or periureteral stranding. Urinary bladder is unremarkable. GI AND BOWEL: Stomach demonstrates no acute abnormality. There is no bowel obstruction. Normal appendix. PERITONEUM AND RETROPERITONEUM: No ascites. No free air. VASCULATURE: Aorta is normal in  caliber. LYMPH NODES: No lymphadenopathy. REPRODUCTIVE ORGANS: No acute abnormality. BONES AND SOFT TISSUES: No acute osseous abnormality. No focal soft tissue abnormality. IMPRESSION: 1. Small stones in the proximal right ureter with mild right hydronephrosis. Ureteral wall enhancement, concerning for superimposed infection/pyelonephritis. Electronically signed by: Franky Crease MD 06/17/2024 03:32 AM EST RP Workstation: [id number]   Case discussed with Dr. Midge.   Imaging, labs and notes reviewed.   Assessment/Plan: 2mm right  ureteral stone with additional renal stones with obstruction, difficult pain control and UTI.   I will take her for cystoscopy with right RTG, ureteral stent insertion.  Risks of bleeding, infection, urinary tract injury, need for secondary procedures and anesthetic complications reviewed.         No follow-ups on file.    CC: Dr. Nancyann Kin     Norleen Seltzer 06/17/2024

## 2024-06-17 NOTE — ED Notes (Signed)
 CRITICAL VALUE STICKER  CRITICAL VALUE: Glucose 514  RECEIVER (on-site recipient of call): Jolynne Spurgin, Leeroy Crumbly  DATE & TIME NOTIFIED: 2:59 AM   MD NOTIFIED: [place]  TIME OF NOTIFICATION: 3:00 AM

## 2024-06-17 NOTE — Op Note (Signed)
 Procedure: 1.  Cystoscopy with right retrograde pyelogram and interpretation. 2.  Cystoscopy with insertion of right double-J stent. 3.  Application of fluoroscopy.  Pre-op diagnosis: Right proximal ureteral stones with obstruction, pain and urinary infection.  Postop diagnosis: Same with chronic follicular cystitis.  Surgeon: Dr. Norleen Seltzer.  Anesthesia: General.  Specimen: None.  Drains: 6 French by 24 cm right Contour double-J stent without tether.  EBL: None.  Complications: None.  Indications: The patient is a 48 year old Hispanic female who was seen in the [facility] emergency room this morning with right flank pain with nausea and vomiting.  CT showed a 2 mm obstructing right proximal stone with additional stones adjacent in the ureter.  Her urine was consistent with infection and her pain was difficult to control so it was felt that stenting was indicated.  Procedure: She had received Rocephin  in the ER.  She was taken the operating room where general anesthetic was induced.  She was placed in lithotomy position.  Her perineum and genitalia were prepped with Betadine  solution she was draped in usual sterile fashion.  Cystoscopy was performed using the 21 French scope and 30 degree lens.  Examination revealed a normal urethra.  The bladder wall was smooth but studded with lesions consistent with chronic follicular cystitis particular on the base of the bladder and trigone.  Ureteral orifices were unremarkable.  No tumors or stones were identified.  The right ureteral orifice was cannulated with a 5 French open-ended catheter and Omnipaque  was instilled.  The right retrograde pyelogram demonstrated mild dilation of the ureter proximal to the intramural ureter with some tortuosity in the mid ureter and just below a filling defect which was consistent with a stone.  There was dilation of the collecting system.  A sensor wire was then advanced through the open-ended catheter and was  negotiated through the tortuous ureter into the intrarenal collecting system.  The open-ended catheter was removed and there was efflux of bloody purulent urine alongside that wire.  A 6 French by 24 cm contour double-J stent was then passed to the right kidney under fluoroscopic guidance.  The wire was removed, leaving good coil in the kidney and a good coil in the bladder.  The cystoscope was removed and a 16 French Foley catheter was inserted.  The balloon was filled with 10 mL of sterile fluid.  The catheter was placed to straight drainage.  She was taken down from lithotomy position, her anesthetic was reversed and she was moved to recovery in stable condition.  There were no complications.

## 2024-06-17 NOTE — H&P (Addendum)
 History and Physical    Patient: Rhonda Hawkins DOB: 1975-08-30 DOA: 06/17/2024 DOS: the patient was seen and examined on 06/17/2024 PCP: Carol Catalan, PA-C  Patient coming from: Home  Chief Complaint:  Chief Complaint  Patient presents with   Abdominal Pain   HPI: Rhonda Hawkins is a 48 y.o. female with medical history significant of unspecified anemia, type 2 diabetes, hypothyroidism, hyperlipidemia allergic rhinitis, complicated grieving, mixed anxiety and depressive disorder, dental abscess, viral URI, vaginal bleeding, vitamin D deficiency, history of UTI who presented to the emergency department with complaints of right flank pain a for the past 3 days associated ssociated with mild suprapubic tenderness, cloudy urine, 2 episodes of emesis the first day and persistent nausea since then with decreased appetite.  She denied fever, but has had chills and night sweats.  No rhinorrhea, sore throat, wheezing or hemoptysis.  No chest pain, palpitations, diaphoresis, PND, orthopnea or pitting edema of the lower extremities.  No diarrhea, constipation, melena or hematochezia.  No frequency or hematuria.  No polyuria, polydipsia, polyphagia or blurred vision.   Lab work: Urinalysis showed extra crawler with cloudy appearance, specific gravity greater than 1.046, glucose greater than 500 and ketones of 20 mg/dL, moderate leukocyte esterase, 6-10 RBC, greater than 50 WBC, rare bacteria and positive WBC clumps.  CBC showed white count of 9.6, hemoglobin 13.1 g/dL and platelets 848.  Lipase was 90 units/L.  CMP shows sodium 128, potassium 4.1, chloride 94 and CO2 21 mmol/L with a normal anion gap.  Glucose level was 514 mg/dL, renal function, calcium level and the LFTs were normal.  Imaging: CT abdomen/pelvis with contrast shows small stones in the proximal right ureter with mild right hydronephrosis.  Ureteral wall enhancement, concerning for superimposed  infection/pyelonephritis.  ED course: Initial vital signs were temperature 98.5 F, pulse 122, respirations 16, BP 118/84 mmHg and O2 sat 97% on room air.  The patient received 2000 mL of normal saline bolus, ondansetron  4 mg IVP, morphine  4 mg IVP, LR 1000 mL liter bolus, ketorolac  15 mg IVP, hydromorphone  0.5 mg IVP, fentanyl  50 mcg IVP, acetaminophen  1000 mg p.o. x 1 dose and ceftriaxone 1 g IVPB.   Review of Systems: As mentioned in the history of present illness. All other systems reviewed and are negative. Past Medical History:  Diagnosis Date   Diabetes mellitus without complication (HCC)    Past Surgical History:  Procedure Laterality Date   DILATION AND CURETTAGE OF UTERUS     LAPAROTOMY  02/07/2011   Procedure: LAPAROTOMY;  Surgeon: Norleen LULLA Server, MD;  Location: [facility];  Service: Gynecology;  Laterality: N/A;   SUPRACERVICAL ABDOMINAL HYSTERECTOMY N/A 03/02/2020   Procedure: HYSTERECTOMY SUPRACERVICAL ABDOMINAL;  Surgeon: Server Norleen LULLA, MD;  Location: [facility];  Service: Gynecology;  Laterality: N/A;   TUBAL LIGATION  02/07/2011   Procedure: BILATERAL TUBAL LIGATION;  Surgeon: Norleen LULLA Server, MD;  Location: [facility];  Service: Gynecology;  Laterality: Bilateral;  Bilateral salpingectomy   Social History:  reports that she has never smoked. She has never used smokeless tobacco. She reports that she does not drink alcohol and does not use drugs.  No Known Allergies  Family History  Problem Relation Age of Onset   Anesthesia problems Mother        difficulty awakening after anesthesia   Anesthesia problems Sister        nausea and vomiting    Prior to Admission medications   Medication Sig Start Date End  Date Taking? Authorizing Provider  Cholecalciferol 1.25 MG (50000 UT) TABS  04/17/20   [provider]  fenofibrate  (TRICOR ) 145 MG tablet Take 145 mg by mouth daily.    [provider]  ferrous sulfate 325 (65 FE) MG tablet Take 325 mg by mouth every  evening.    [provider]  glipiZIDE  (GLUCOTROL ) 10 MG tablet Take 20 mg by mouth 2 (two) times daily before a meal.     [provider]    Physical Exam: Vitals:   06/17/24 0643 06/17/24 0645 06/17/24 0953 06/17/24 0954  BP:  121/89 118/69 117/71  Pulse:  (!) 118 (!) 116 (!) 117  Resp:  (!) 22 18 18   Temp: 98.5 F (36.9 C)  98.2 F (36.8 C) 98.7 F (37.1 C)  TempSrc: Oral  Oral Oral  SpO2:  97% 98% 96%  Weight:      Height:       Physical Exam Vitals reviewed.  Constitutional:      Appearance: She is ill-appearing.  HENT:     Head: Normocephalic.     Nose: No rhinorrhea.     Mouth/Throat:     Mouth: Mucous membranes are dry.  Eyes:     General: No scleral icterus.    Pupils: Pupils are equal, round, and reactive to light.  Cardiovascular:     Rate and Rhythm: Regular rhythm. Tachycardia present.  Pulmonary:     Effort: Pulmonary effort is normal.     Breath sounds: Normal breath sounds.  Abdominal:     General: Bowel sounds are normal.     Palpations: Abdomen is soft.     Tenderness: There is abdominal tenderness in the right upper quadrant. There is right CVA tenderness. There is no left CVA tenderness, guarding or rebound.  Musculoskeletal:     Cervical back: Neck supple.     Right lower leg: No edema.     Left lower leg: No edema.  Skin:    General: Skin is warm and dry.  Neurological:     General: No focal deficit present.     Mental Status: She is alert and oriented to person, place, and time.  Psychiatric:        Mood and Affect: Mood normal.        Behavior: Behavior normal.     Data Reviewed:  Results are pending, will review when available.  EKG: Vent. rate 122 BPM PR interval 133 ms QRS duration 83 ms QT/QTcB 311/443 ms P-R-T axes 47 65 60 Sinus tachycardia No previous ECGs available  Assessment and Plan: Principal Problem:   Urolithiasis Complicated by:   Hydronephrosis of right kidney And:   Pyelonephritis of  right kidney Observation/MedSurg. Continue IV fluids. Keep n.p.o. for now. Analgesics as needed. Antiemetics as needed. Foley may come out tomorrow. Follow CBC, CMP in AM.  Active Problems:   Pseudohyponatremia Secondary to:   Type 2 diabetes mellitus with hyperglycemia (HCC) Continue IV fluids. Carbohydrate modified diet. CBG monitoring with RI SS while in the hospital. Check hemoglobin A1c.    Elevated lipase Normal pancreas on imaging. Symptoms consistent with renal colic. No further testing unless change in symptoms.    Hyperlipidemia Currently not on therapy. Follow-up with primary care provider for treatment options.    Hypothyroidism Not on levothyroxine. No symptoms of hypothyroidism. PCP to check TSH level in a few weeks after acute illness.    Advance Care Planning:   Code Status: Full Code   Consults: Urology (  John Wrenn, MD).  Family Communication:   Severity of Illness: The appropriate patient status for this patient is INPATIENT. Inpatient status is judged to be reasonable and necessary in order to provide the required intensity of service to ensure the patient's safety. The patient's presenting symptoms, physical exam findings, and initial radiographic and laboratory data in the context of their chronic comorbidities is felt to place them at high risk for further clinical deterioration. Furthermore, it is not anticipated that the patient will be medically stable for discharge from the hospital within 2 midnights of admission.   * I certify that at the point of admission it is my clinical judgment that the patient will require inpatient hospital care spanning beyond 2 midnights from the point of admission due to high intensity of service, high risk for further deterioration and high frequency of surveillance required.*  Author: Alm Dorn Castor, MD 06/17/2024 9:56 AM  For on call review [email or web address].   This document was prepared using Dragon voice  recognition software and may contain some unintended transcription errors.

## 2024-06-17 NOTE — ED Provider Notes (Signed)
  Physical Exam  BP 121/89   Pulse (!) 118   Temp 98.5 F (36.9 C) (Oral)   Resp (!) 22   Ht 5' 1 (1.549 m)   Wt 52.6 kg   LMP 02/02/2020   SpO2 97%   BMI 21.92 kg/m   Physical Exam  Procedures  Procedures  ED Course / MDM   Clinical Course as of 06/17/24 0753  Fri Jun 17, 2024  0308 Glucose(!!): 514 Hyperglycemia [DW]  9384 Patient found to have had a proximal ureteral stone with hydronephrosis.  She does have evidence of infection Patient to be given multiple analgesics without relief.  She is already been given antibiotics Will consult urology [DW]  [phone] Discussed with Dr. Watt  with urology He will plan to see the patient in the ER  I utilized Spanish interpreter to inform patient of her diagnosis and plan.  She still reports pain [DW]  0701 Signed to dr francesca at shift change to f/u on urology recommendations [DW]  0703 Received sign out from Dr. Midge pending urology evaluation. Has kidney stone, concern for pyelonephritis [WS]  0752 Discussed with Dr. Watt, request patient transferred to [facility] ER. Dr Suzette accepting. Patient remains slightly tachycardic, will give additional fluids. Patient agreeable to transfer.  [WS]    Clinical Course User Index [DW] Midge Golas, MD [WS] Francesca Elsie CROME, MD   Medical Decision Making Amount and/or Complexity of Data Reviewed Labs: ordered. Decision-making details documented in ED Course. Radiology: ordered.  Risk Prescription drug management.   CRITICAL CARE Performed by: Elsie CROME Francesca   Total critical care time: 35 minutes  Critical care time was exclusive of separately billable procedures and treating other patients.  Critical care was necessary to treat or prevent imminent or life-threatening deterioration.  Critical care was time spent personally by me on the following activities: development of treatment plan with patient and/or surrogate as well as nursing, discussions with consultants,  evaluation of patient's response to treatment, examination of patient, obtaining history from patient or surrogate, ordering and performing treatments and interventions, ordering and review of laboratory studies, ordering and review of radiographic studies, pulse oximetry and re-evaluation of patient's condition.       Francesca Elsie CROME, MD 06/17/24 [phone]

## 2024-06-17 NOTE — Anesthesia Postprocedure Evaluation (Signed)
 Anesthesia Post Note  Patient: Josslyn Ciolek Platanero-Velazquez  Procedure(s) Performed: CYSTOSCOPY, WITH RETROGRADE PYELOGRAM AND URETERAL STENT INSERTION (Right: Ureter)     Patient location during evaluation: PACU Anesthesia Type: General Level of consciousness: awake and alert, oriented and patient cooperative Pain management: pain level controlled Vital Signs Assessment: post-procedure vital signs reviewed and stable Respiratory status: spontaneous breathing, nonlabored ventilation and respiratory function stable Cardiovascular status: blood pressure returned to baseline and stable Postop Assessment: no apparent nausea or vomiting Anesthetic complications: no   No notable events documented.  Last Vitals:  Vitals:   06/17/24 1230 06/17/24 1245  BP: 95/66 104/69  Pulse: 98 (!) 104  Resp: 14 13  Temp:    SpO2: 100% 100%    Last Pain:  Vitals:   06/17/24 1245  TempSrc:   PainSc: 0-No pain                 Almarie CHRISTELLA Marchi

## 2024-06-17 NOTE — ED Triage Notes (Addendum)
 Pt reports right sided abdominal pain starting 3 days ago. Denies N/V/D. Eating/drinking normally. Pain is intermittent. Patient tachycardic in triage. Denies fever. Associated bloating/distention

## 2024-06-17 NOTE — Anesthesia Preprocedure Evaluation (Addendum)
 Anesthesia Evaluation  Patient identified by MRN, date of birth, ID band Patient awake    Reviewed: Allergy & Precautions, NPO status , Patient's Chart, lab work & pertinent test results  History of Anesthesia Complications (+) PONV and history of anesthetic complications (w/ hysterectomy)  Airway Mallampati: III  TM Distance: >3 FB Neck ROM: Full    Dental  (+) Teeth Intact, Dental Advisory Given   Pulmonary neg pulmonary ROS   Pulmonary exam normal breath sounds clear to auscultation       Cardiovascular negative cardio ROS Normal cardiovascular exam Rhythm:Regular Rate:Normal     Neuro/Psych  PSYCHIATRIC DISORDERS Anxiety Depression    negative neurological ROS     GI/Hepatic negative GI ROS, Neg liver ROS,,,  Endo/Other  diabetes, Well Controlled, Type 2, Oral Hypoglycemic AgentsHypothyroidism  Not on home insulin , glucose 514 on admission overnight, down to 346 at 459AM. Per pt has only been on glipizide  but glucoses have continued to be high at her appts. Does not check fingersticks at home  Renal/GU negative Renal ROS  negative genitourinary   Musculoskeletal negative musculoskeletal ROS (+)    Abdominal   Peds  Hematology negative hematology ROS (+) Hb 13.1   Anesthesia Other Findings   Reproductive/Obstetrics negative OB ROS S/p TL                              Anesthesia Physical Anesthesia Plan  ASA: 3  Anesthesia Plan: General   Post-op Pain Management: Tylenol  PO (pre-op)*   Induction: Intravenous  PONV Risk Score and Plan: 4 or greater and Ondansetron , Dexamethasone, Midazolam  and Scopolamine  patch - Pre-op  Airway Management Planned: LMA  Additional Equipment: None  Intra-op Plan:   Post-operative Plan: Extubation in OR  Informed Consent: I have reviewed the patients History and Physical, chart, labs and discussed the procedure including the risks, benefits  and alternatives for the proposed anesthesia with the patient or authorized representative who has indicated his/her understanding and acceptance.     Dental advisory given  Plan Discussed with: CRNA  Anesthesia Plan Comments: (LD toradol  542AM Insulin  in preop 11 units for FS 375)         Anesthesia Quick Evaluation

## 2024-06-17 NOTE — ED Notes (Signed)
 [facility] called to transport patient. Nurse notified. Spoke to [place].

## 2024-06-17 NOTE — ED Provider Notes (Signed)
 [place] EMERGENCY DEPARTMENT AT [facility] Provider Note   CSN: [id number] Arrival date & time: 06/17/24  9980     Patient presents with: Abdominal Pain   Rhonda Hawkins is a 48 y.o. female.   The history is provided by the patient. The history is limited by a language barrier. A language interpreter was used (spanish - sergio - [phone]).  Abdominal Pain Pain radiates to:  RUQ and RLQ Duration:  3 days Timing:  Constant Progression:  Worsening Chronicity:  New Relieved by:  Nothing Associated symptoms: no constipation, no diarrhea, no dysuria, no fever, no hematemesis and no vomiting   Pt reports onset of abd pain about 3 days ago after eating tacos Her pain is worsening No vomiting/diarrhea   PMH- diabetes Previous surgery includes hysterectomy Prior to Admission medications   Medication Sig Start Date End Date Taking? Authorizing Provider  Cholecalciferol 1.25 MG (50000 UT) TABS  04/17/20   [provider]  fenofibrate  (TRICOR ) 145 MG tablet Take 145 mg by mouth daily.    [provider]  ferrous sulfate 325 (65 FE) MG tablet Take 325 mg by mouth every evening.    [provider]  glipiZIDE  (GLUCOTROL ) 10 MG tablet Take 20 mg by mouth 2 (two) times daily before a meal.     [provider]    Allergies: Patient has no known allergies.    Review of Systems  Constitutional:  Negative for fever.  Gastrointestinal:  Positive for abdominal pain. Negative for constipation, diarrhea, hematemesis and vomiting.  Genitourinary:  Negative for dysuria.    Updated Vital Signs BP 110/75   Pulse (!) 106   Temp 98.5 F (36.9 C) (Oral)   Resp 19   Ht 1.549 m (5' 1)   Wt 52.6 kg   LMP 02/02/2020   SpO2 95%   BMI 21.92 kg/m   Physical Exam CONSTITUTIONAL: Well developed/well nourished HEAD: Normocephalic/atraumatic EYES: EOMI/PERRL ENMT: Mucous membranes moist NECK: supple no meningeal signs CV: S1/S2 noted,  tachycardic LUNGS: Lungs are clear to auscultation bilaterally, no apparent distress ABDOMEN: soft, moderate RUQ and RLQ tenderness, no rebound or guarding, bowel sounds noted throughout abdomen GU:no cva tenderness NEURO: Pt is awake/alert/appropriate, moves all extremitiesx4.  No facial droop.   EXTREMITIES: pulses normal/equal, full ROM SKIN: warm, color normal PSYCH: Mildly anxious  (all labs ordered are listed, but only abnormal results are displayed) Labs Reviewed  CBC WITH DIFFERENTIAL/PLATELET - Abnormal; Notable for the following components:      Result Value   RDW 11.3 (*)    All other components within normal limits  COMPREHENSIVE METABOLIC PANEL WITH GFR - Abnormal; Notable for the following components:   Sodium 128 (*)    Chloride 94 (*)    CO2 21 (*)    Glucose, Bld 514 (*)    All other components within normal limits  LIPASE, BLOOD - Abnormal; Notable for the following components:   Lipase 90 (*)    All other components within normal limits  URINALYSIS, ROUTINE W REFLEX MICROSCOPIC - Abnormal; Notable for the following components:   Color, Urine STRAW (*)    APPearance CLOUDY (*)    Specific Gravity, Urine >1.046 (*)    Glucose, UA >=500 (*)    Ketones, ur 20 (*)    Leukocytes,Ua MODERATE (*)    Bacteria, UA RARE (*)    All other components within normal limits  CBG MONITORING, ED - Abnormal; Notable for the following components:  Glucose-Capillary 346 (*)    All other components within normal limits  URINE CULTURE    EKG: EKG Interpretation Date/Time:  Friday June 17 2024 01:10:39 EST Ventricular Rate:  122 PR Interval:  133 QRS Duration:  83 QT Interval:  311 QTC Calculation: 443 R Axis:   65  Text Interpretation: Sinus tachycardia No previous ECGs available Confirmed by Midge Golas ([id number]) on 06/17/2024 1:33:03 AM  Radiology: CT ABDOMEN PELVIS W CONTRAST Result Date: 06/17/2024 EXAM: CT ABDOMEN AND PELVIS WITH CONTRAST 06/17/2024  03:21:31 AM TECHNIQUE: CT of the abdomen and pelvis was performed with the administration of 100 mL of iohexol (OMNIPAQUE) 300 MG/ML solution. Multiplanar reformatted images are provided for review. Automated exposure control, iterative reconstruction, and/or weight-based adjustment of the mA/kV was utilized to reduce the radiation dose to as low as reasonably achievable. COMPARISON: 07/05/2018 CLINICAL HISTORY: RLQ abdominal pain. FINDINGS: LOWER CHEST: No acute abnormality. LIVER: The liver is unremarkable. GALLBLADDER AND BILE DUCTS: Gallbladder is unremarkable. No biliary ductal dilatation. SPLEEN: No acute abnormality. PANCREAS: No acute abnormality. ADRENAL GLANDS: No acute abnormality. KIDNEYS, URETERS AND BLADDER: A few small stones are noted in the proximal right ureter measuring up to 2 mm with mild right hydronephrosis. Below the stones, the right ureter remains mildly prominent and there appears to be enhancement of the ureteral wall. Cannot exclude infection/pyelonephritis. No stones in the left kidney or ureter. No left hydronephrosis. No perinephric or periureteral stranding. Urinary bladder is unremarkable. GI AND BOWEL: Stomach demonstrates no acute abnormality. There is no bowel obstruction. Normal appendix. PERITONEUM AND RETROPERITONEUM: No ascites. No free air. VASCULATURE: Aorta is normal in caliber. LYMPH NODES: No lymphadenopathy. REPRODUCTIVE ORGANS: No acute abnormality. BONES AND SOFT TISSUES: No acute osseous abnormality. No focal soft tissue abnormality. IMPRESSION: 1. Small stones in the proximal right ureter with mild right hydronephrosis. Ureteral wall enhancement, concerning for superimposed infection/pyelonephritis. Electronically signed by: Franky Crease MD 06/17/2024 03:32 AM EST RP Workstation: [id number]     Procedures   Medications Ordered in the ED  morphine  (PF) 2 MG/ML injection 1 mg (has no administration in time range)  fentaNYL  (SUBLIMAZE ) injection 50 mcg (50 mcg  Intravenous Given 06/17/24 0247)  sodium chloride  0.9 % bolus 1,000 mL (0 mLs Intravenous Stopped 06/17/24 0454)  ondansetron  (ZOFRAN ) injection 4 mg (4 mg Intravenous Given 06/17/24 0247)  sodium chloride  0.9 % bolus 1,000 mL (0 mLs Intravenous Stopped 06/17/24 0543)  iohexol (OMNIPAQUE) 300 MG/ML solution 100 mL (100 mLs Intravenous Contrast Given 06/17/24 0312)  cefTRIAXone (ROCEPHIN) 1 g in sodium chloride  0.9 % 100 mL IVPB (0 g Intravenous Stopped 06/17/24 0454)  ketorolac  (TORADOL ) 15 MG/ML injection 15 mg (15 mg Intravenous Given 06/17/24 0542)    Clinical Course as of 06/17/24 0701  Fri Jun 17, 2024  0308 Glucose(!!): 514 Hyperglycemia [DW]  9384 Patient found to have had a proximal ureteral stone with hydronephrosis.  She does have evidence of infection Patient to be given multiple analgesics without relief.  She is already been given antibiotics Will consult urology [DW]  [phone] Discussed with Dr. Watt  with urology He will plan to see the patient in the ER  I utilized Spanish interpreter to inform patient of her diagnosis and plan.  She still reports pain [DW]  0701 Signed to dr francesca at shift change to f/u on urology recommendations [DW]    Clinical Course User Index [DW] Midge Golas, MD  Medical Decision Making Amount and/or Complexity of Data Reviewed Labs: ordered. Decision-making details documented in ED Course. Radiology: ordered.  Risk Prescription drug management.   This patient presents to the ED for concern of abdominal pain, this involves an extensive number of treatment options, and is a complaint that carries with it a high risk of complications and morbidity.  The differential diagnosis includes but is not limited to cholecystitis, cholelithiasis, pancreatitis, gastritis, peptic ulcer disease, appendicitis, bowel obstruction, bowel perforation, diverticulitis, ischemic bowel   Comorbidities that complicate the  patient evaluation: Patient's presentation is complicated by their history of diabetes  Social Determinants of Health: Patient's limited english proficiencyand lack of access to insurance increases the complexity of managing their presentation  Additional history obtained: Records reviewed Primary Care Documents  Lab Tests: I Ordered, and personally interpreted labs.  The pertinent results include: Hyperglycemia without anion gap  Imaging Studies ordered: I ordered imaging studies including CT scan abdomen pelvis  I independently visualized and interpreted imaging which showed right ureteral stone with hydronephrosis I agree with the radiologist interpretation  Cardiac Monitoring: The patient was maintained on a cardiac monitor.  I personally viewed and interpreted the cardiac monitor which showed an underlying rhythm of:  sinus tachycardia  Medicines ordered and prescription drug management: I ordered medication including fentanyl  for pain Reevaluation of the patient after these medicines showed that the patient    stayed the same  Critical Interventions:  . IV fluids and antibiotics  Consultations Obtained: I requested consultation with the consultant urology, and discussed  findings as well as pertinent plan - they recommend: Will evaluate the patient in the ER  Reevaluation: After the interventions noted above, I reevaluated the patient and found that they have :stayed the same  Complexity of problems addressed: Patient's presentation is most consistent with  acute presentation with potential threat to life or bodily function       Final diagnoses:  Right ureteral stone  Hyperglycemia    ED Discharge Orders     None          Midge Golas, MD 06/17/24 [phone]

## 2024-06-17 NOTE — Transfer of Care (Signed)
 Immediate Anesthesia Transfer of Care Note  Patient: Rhonda Hawkins  Procedure(s) Performed: Procedure(s) with comments: CYSTOSCOPY, WITH RETROGRADE PYELOGRAM AND URETERAL STENT INSERTION (Right) - CYSTOSCOPY, INSERTION OF RIGHT URETERAL STENT  Patient Location: PACU  Anesthesia Type:General  Level of Consciousness:  sedated, patient cooperative and responds to stimulation  Airway & Oxygen Therapy:Patient Spontanous Breathing and Patient connected to face mask oxgen  Post-op Assessment:  Report given to PACU RN and Post -op Vital signs reviewed and stable  Post vital signs:  Reviewed and stable  Last Vitals:  Vitals:   06/17/24 0954 06/17/24 1230  BP: 117/71 95/66  Pulse: (!) 117 98  Resp: 18 14  Temp: 37.1 C   SpO2: 96% 100%    Complications: No apparent anesthesia complications

## 2024-06-17 NOTE — Plan of Care (Signed)

## 2024-06-17 NOTE — Anesthesia Procedure Notes (Signed)
 Procedure Name: LMA Insertion Date/Time: 06/17/2024 12:00 PM  Performed by: Vincenzo Show, CRNAPre-anesthesia Checklist: Patient identified, Emergency Drugs available, Suction available, Patient being monitored and Timeout performed Patient Re-evaluated:Patient Re-evaluated prior to induction Oxygen Delivery Method: Circle system utilized Preoxygenation: Pre-oxygenation with 100% oxygen Induction Type: IV induction Ventilation: Mask ventilation without difficulty LMA: LMA inserted LMA Size: 4.0 Number of attempts: 1 Placement Confirmation: positive ETCO2 and breath sounds checked- equal and bilateral Tube secured with: Tape Dental Injury: Teeth and Oropharynx as per pre-operative assessment  Comments: Easy pass LMA.

## 2024-06-17 NOTE — ED Provider Notes (Signed)
 Patient transferred from [facility] with an infected kidney stone.  Patient is being admitted to medicine and urology is consulting and will probably place a ureteral stent   Suzette Pac, MD 06/17/24 1005

## 2024-06-18 ENCOUNTER — Encounter (HOSPITAL_COMMUNITY): Payer: Self-pay | Admitting: Urology

## 2024-06-18 LAB — HIV ANTIBODY (ROUTINE TESTING W REFLEX): HIV Screen 4th Generation wRfx: NONREACTIVE

## 2024-06-18 LAB — COMPREHENSIVE METABOLIC PANEL WITH GFR
ALT: 17 U/L (ref 0–44)
AST: 13 U/L — ABNORMAL LOW (ref 15–41)
Albumin: 3 g/dL — ABNORMAL LOW (ref 3.5–5.0)
Alkaline Phosphatase: 53 U/L (ref 38–126)
Anion gap: 16 — ABNORMAL HIGH (ref 5–15)
BUN: 7 mg/dL (ref 6–20)
CO2: 17 mmol/L — ABNORMAL LOW (ref 22–32)
Calcium: 8 mg/dL — ABNORMAL LOW (ref 8.9–10.3)
Chloride: 103 mmol/L (ref 98–111)
Creatinine, Ser: 0.55 mg/dL (ref 0.44–1.00)
GFR, Estimated: >60 mL/min (ref >60)
Glucose, Bld: 174 mg/dL — ABNORMAL HIGH (ref 70–99)
Potassium: 3.5 mmol/L (ref 3.5–5.1)
Sodium: 135 mmol/L (ref 135–145)
Total Bilirubin: 0.5 mg/dL (ref 0.0–1.2)
Total Protein: 6.5 g/dL (ref 6.5–8.1)

## 2024-06-18 LAB — CBC
HCT: 34.5 % — ABNORMAL LOW (ref 36.0–46.0)
Hemoglobin: 11.5 g/dL — ABNORMAL LOW (ref 12.0–15.0)
MCH: 29.6 pg (ref 26.0–34.0)
MCHC: 33.3 g/dL (ref 30.0–36.0)
MCV: 88.9 fL (ref 80.0–100.0)
Platelets: 150 10*3/uL (ref 150–400)
RBC: 3.88 MIL/uL (ref 3.87–5.11)
RDW: 11.7 % (ref 11.5–15.5)
WBC: 7.8 10*3/uL (ref 4.0–10.5)
nRBC: 0 % (ref 0.0–0.2)

## 2024-06-18 LAB — VITAMIN D 25 HYDROXY (VIT D DEFICIENCY, FRACTURES): Vit D, 25-Hydroxy: 52.25 ng/mL (ref 30–100)

## 2024-06-18 LAB — GLUCOSE, CAPILLARY
Glucose-Capillary: 186 mg/dL — ABNORMAL HIGH (ref 70–99)
Glucose-Capillary: 179 mg/dL — ABNORMAL HIGH (ref 70–99)
Glucose-Capillary: 255 mg/dL — ABNORMAL HIGH (ref 70–99)
Glucose-Capillary: 204 mg/dL — ABNORMAL HIGH (ref 70–99)

## 2024-06-18 LAB — TSH: TSH: 1.56 u[IU]/mL (ref 0.350–4.500)

## 2024-06-18 NOTE — Progress Notes (Signed)
 PROGRESS NOTE    Rhonda Hawkins  FMW:[id number] DOB: 12-Mar-1976 DOA: 06/17/2024 PCP: Carol Catalan, PA-C   Brief Narrative: 48 y.o. female with medical history significant of unspecified anemia, type 2 diabetes, hypothyroidism, hyperlipidemia allergic rhinitis, complicated grieving, mixed anxiety and depressive disorder, dental abscess, viral URI, vaginal bleeding, vitamin D  deficiency, history of UTI who presented to the emergency department with complaints of right flank pain a for the past 3 days associated ssociated with mild suprapubic tenderness, cloudy urine, 2 episodes of emesis the first day and persistent nausea since then with decreased appetite.  She denied fever, but has had chills and night sweats.  No rhinorrhea, sore throat, wheezing or hemoptysis.  No chest pain, palpitations, diaphoresis, PND, orthopnea or pitting edema of the lower extremities.  No diarrhea, constipation, melena or hematochezia.  No frequency or hematuria.  No polyuria, polydipsia, polyphagia or blurred vision.   Lab work: Urinalysis showed  glucose greater than 500 and ketones of 20 mg/dL, moderate leukocyte esterase, 6-10 RBC, greater than 50 WBC, rare bacteria and positive WBC clumps.  CBC showed white count of 9.6, hemoglobin 13.1 g/dL and platelets 848.  Lipase was 90 units/L.  CMP shows sodium 128, potassium 4.1, chloride 94 and CO2 21 mmol/L with a normal anion gap.  Glucose level was 514 mg/dL, renal function, calcium level and the LFTs were normal.   Imaging: CT abdomen/pelvis with contrast shows small stones in the proximal right ureter with mild right hydronephrosis.  Ureteral wall enhancement, concerning for superimposed infection/pyelonephritis.  Assessment & Plan:   Principal Problem:   Pyelonephritis of right kidney Active Problems:   Hyperlipidemia   Hypothyroidism   Pseudohyponatremia   Type 2 diabetes mellitus with hyperglycemia (HCC)   Hydronephrosis of right kidney    Urolithiasis   Elevated lipase  Right urolithiasis with hydronephrosis and pyelonephritis of the right kidney status post cystoscopy with insertion of right double-J stent, she denies any pain today.  Denies nausea vomiting.  Foley in place draining clear urine.  Urology planning on deseeding Foley catheter today.  Follow-up final urine culture.  Continue Rocephin . Urine culture more than 100,000 colonies of E. coli.  Sensitivities are pending.  Type 2 diabetes uncontrolled with hyperglycemia-hemoglobin A1c pending Continue resistant sliding scale insulin   CBG (last 3)  Recent Labs    06/17/24 2046 06/18/24 0739 06/18/24 1207  GLUCAP 199* 186* 179*   She is not on any therapy at home for diabetes.   Hyperlipidemia not on any statins prior to admission  Hypothyroidism not on replacement.  No TSH on record.  Pseudohyponatremia secondary to hyperglycemia resolved  Elevated lipase with normal-appearing pancreas by CT Continue supportive measures    Estimated body mass index is 21.92 kg/m as calculated from the following:   Height as of this encounter: 5' 1 (1.549 m).   Weight as of this encounter: 52.6 kg.  DVT prophylaxis:lovenox  Code Status:full Family Communication:none Disposition Plan:  Status is: Inpatient Remains inpatient appropriate because:    Consultants:  uro  Procedures: s/p cysto Antimicrobialsrocephin   Subjective: Resting in bed her kids are on both sides she denies any pain denies nausea vomiting Foley still in place  Objective: Vitals:   06/17/24 1732 06/17/24 2141 06/18/24 0430 06/18/24 1210  BP: 106/67 111/69 (!) 117/59 (!) 95/58  Pulse: (!) 104 (!) 102 88 97  Resp: 18 15 15 18   Temp: 98.7 F (37.1 C) 98.3 F (36.8 C) 97.9 F (36.6 C) 99 F (37.2 C)  TempSrc:  Oral     SpO2: 94% 96% 98% 98%  Weight:      Height:        Intake/Output Summary (Last 24 hours) at 06/18/2024 1330 Last data filed at 06/18/2024 0513 Gross per 24 hour   Intake --  Output 1625 ml  Net -1625 ml   Filed Weights   06/17/24 0102 06/17/24 1018  Weight: 52.6 kg 52.6 kg    Examination:  General exam: Appears calm and comfortable  Respiratory system: Clear to auscultation. Respiratory effort normal. Cardiovascular system: S1 & S2 heard, RRR. No JVD, murmurs, rubs, gallops or clicks. No pedal edema. Gastrointestinal system: Abdomen is nondistended, soft and nontender. No organomegaly or masses felt. Normal bowel sounds heard. Central nervous system: Alert and oriented. No focal neurological deficits. Extremities: Symmetric 5 x 5 power. Skin: No rashes, lesions or ulcers Psychiatry: Judgement and insight appear normal. Mood & affect appropriate.     Data Reviewed: I have personally reviewed following labs and imaging studies  CBC: Recent Labs  Lab 06/17/24 0150 06/18/24 0514  WBC 9.6 7.8  NEUTROABS 6.9  --   HGB 13.1 11.5*  HCT 37.8 34.5*  MCV 85.9 88.9  PLT 151 150   Basic Metabolic Panel: Recent Labs  Lab 06/17/24 0150 06/18/24 0514  NA 128* 135  K 4.1 3.5  CL 94* 103  CO2 21* 17*  GLUCOSE 514* 174*  BUN 11 7  CREATININE 0.69 0.55  CALCIUM 8.9 8.0*   GFR: Estimated Creatinine Clearance: 64.2 mL/min (by C-G formula based on SCr of 0.55 mg/dL). Liver Function Tests: Recent Labs  Lab 06/17/24 0150 06/18/24 0514  AST 16 13*  ALT 27 17  ALKPHOS 88 53  BILITOT 1.0 0.5  PROT 7.6 6.5  ALBUMIN 3.7 3.0*   Recent Labs  Lab 06/17/24 0150  LIPASE 90*   No results for input(s): AMMONIA in the last 168 hours. Coagulation Profile: No results for input(s): INR, PROTIME in the last 168 hours. Cardiac Enzymes: No results for input(s): CKTOTAL, CKMB, CKMBINDEX, TROPONINI in the last 168 hours. BNP (last 3 results) No results for input(s): PROBNP in the last 8760 hours. HbA1C: No results for input(s): HGBA1C in the last 72 hours. CBG: Recent Labs  Lab 06/17/24 1235 06/17/24 1635 06/17/24 2046  06/18/24 0739 06/18/24 1207  GLUCAP 275* 106* 199* 186* 179*   Lipid Profile: No results for input(s): CHOL, HDL, LDLCALC, TRIG, CHOLHDL, LDLDIRECT in the last 72 hours. Thyroid Function Tests: No results for input(s): TSH, T4TOTAL, FREET4, T3FREE, THYROIDAB in the last 72 hours. Anemia Panel: No results for input(s): VITAMINB12, FOLATE, FERRITIN, TIBC, IRON, RETICCTPCT in the last 72 hours. Sepsis Labs: Recent Labs  Lab 06/17/24 9171  LATICACIDVEN 1.2    Recent Results (from the past 240 hours)  Urine Culture     Status: Abnormal (Preliminary result)   Collection Time: 06/17/24  3:57 AM   Specimen: Urine, Clean Catch  Result Value Ref Range Status   Specimen Description   Final    URINE, CLEAN CATCH Performed at [facility], [address] [place], [place] [address]    Special Requests   Final    NONE Performed at [facility], [address], [place], [place] [address]    Culture (A)  Final    >=100,000 COLONIES/mL ESCHERICHIA COLI SUSCEPTIBILITIES TO FOLLOW Performed at [facility], [address] [place], [place] [address]    Report Status PENDING  Incomplete  Culture, blood (routine x 2)  Status: None (Preliminary result)   Collection Time: 06/17/24  8:28 AM   Specimen: BLOOD  Result Value Ref Range Status   Specimen Description BLOOD BLOOD RIGHT FOREARM  Final   Special Requests   Final    BOTTLES DRAWN AEROBIC AND ANAEROBIC Blood Culture adequate volume   Culture   Final    NO GROWTH < 24 HOURS Performed at [facility], [address] [place], [place] [address]    Report Status PENDING  Incomplete  Culture, blood (routine x 2)     Status: None (Preliminary result)   Collection Time: 06/17/24  8:28 AM   Specimen: BLOOD  Result Value Ref Range Status   Specimen Description BLOOD BLOOD RIGHT HAND  Final   Special Requests   Final    BOTTLES DRAWN AEROBIC AND ANAEROBIC Blood Culture adequate volume   Culture    Final    NO GROWTH < 24 HOURS Performed at [facility], [address], [place], [place] [address]    Report Status PENDING  Incomplete         Radiology Studies: DG C-Arm 1-60 Min-No Report Result Date: 06/17/2024 Fluoroscopy was utilized by the requesting physician.  No radiographic interpretation.   CT ABDOMEN PELVIS W CONTRAST Result Date: 06/17/2024 EXAM: CT ABDOMEN AND PELVIS WITH CONTRAST 06/17/2024 03:21:31 AM TECHNIQUE: CT of the abdomen and pelvis was performed with the administration of 100 mL of iohexol (OMNIPAQUE) 300 MG/ML solution. Multiplanar reformatted images are provided for review. Automated exposure control, iterative reconstruction, and/or weight-based adjustment of the mA/kV was utilized to reduce the radiation dose to as low as reasonably achievable. COMPARISON: 07/05/2018 CLINICAL HISTORY: RLQ abdominal pain. FINDINGS: LOWER CHEST: No acute abnormality. LIVER: The liver is unremarkable. GALLBLADDER AND BILE DUCTS: Gallbladder is unremarkable. No biliary ductal dilatation. SPLEEN: No acute abnormality. PANCREAS: No acute abnormality. ADRENAL GLANDS: No acute abnormality. KIDNEYS, URETERS AND BLADDER: A few small stones are noted in the proximal right ureter measuring up to 2 mm with mild right hydronephrosis. Below the stones, the right ureter remains mildly prominent and there appears to be enhancement of the ureteral wall. Cannot exclude infection/pyelonephritis. No stones in the left kidney or ureter. No left hydronephrosis. No perinephric or periureteral stranding. Urinary bladder is unremarkable. GI AND BOWEL: Stomach demonstrates no acute abnormality. There is no bowel obstruction. Normal appendix. PERITONEUM AND RETROPERITONEUM: No ascites. No free air. VASCULATURE: Aorta is normal in caliber. LYMPH NODES: No lymphadenopathy. REPRODUCTIVE ORGANS: No acute abnormality. BONES AND SOFT TISSUES: No acute osseous abnormality. No focal soft tissue abnormality. IMPRESSION:  1. Small stones in the proximal right ureter with mild right hydronephrosis. Ureteral wall enhancement, concerning for superimposed infection/pyelonephritis. Electronically signed by: Franky Crease MD 06/17/2024 03:32 AM EST RP Workstation: [id number]    Scheduled Meds:   HYDROmorphone  (DILAUDID ) injection  0.5 mg Intravenous Once   insulin  aspart  0-9 Units Subcutaneous TID WC   ketorolac   15 mg Intravenous Once   Continuous Infusions:  cefTRIAXone (ROCEPHIN)  IV 1 g (06/18/24 0441)     LOS: 1 day    Almarie KANDICE Hoots, MD  06/18/2024, 1:30 PM

## 2024-06-18 NOTE — Plan of Care (Signed)

## 2024-06-19 LAB — LIPID PANEL
Cholesterol: 119 mg/dL (ref 0–200)
HDL: 24 mg/dL — ABNORMAL LOW (ref >40)
LDL Cholesterol: 62 mg/dL (ref 0–99)
Total CHOL/HDL Ratio: 4.9 ratio
Triglycerides: 164 mg/dL — ABNORMAL HIGH (ref <150)
VLDL: 33 mg/dL (ref 0–40)

## 2024-06-19 LAB — URINE CULTURE: Culture: >=100000 — AB

## 2024-06-19 LAB — HEMOGLOBIN A1C
Hgb A1c MFr Bld: 9.8 % — ABNORMAL HIGH (ref 4.8–5.6)
Mean Plasma Glucose: 235 mg/dL

## 2024-06-19 LAB — GLUCOSE, CAPILLARY
Glucose-Capillary: 312 mg/dL — ABNORMAL HIGH (ref 70–99)
Glucose-Capillary: 294 mg/dL — ABNORMAL HIGH (ref 70–99)

## 2024-06-19 MED ORDER — CEPHALEXIN 500 MG PO CAPS: 500.0000 mg | ORAL_CAPSULE | Freq: Four times a day (QID) | ORAL | 0 refills | Status: AC

## 2024-06-19 MED ORDER — CEPHALEXIN 500 MG PO CAPS
500.0000 mg | ORAL_CAPSULE | Freq: Four times a day (QID) | ORAL | Status: DC
  Administered 2024-06-19: 500 mg via ORAL
  Filled 2024-06-19: qty 1

## 2024-06-19 MED ORDER — CEPHALEXIN 250 MG PO CAPS: 250.0000 mg | ORAL_CAPSULE | Freq: Every day | ORAL | Status: DC

## 2024-06-19 MED ORDER — CEPHALEXIN 250 MG PO CAPS: 250.0000 mg | ORAL_CAPSULE | Freq: Every day | ORAL | 0 refills | Status: DC

## 2024-06-19 MED ORDER — TRAMADOL HCL 50 MG PO TABS: 50.0000 mg | ORAL_TABLET | Freq: Four times a day (QID) | ORAL | 0 refills | Status: AC | PRN

## 2024-06-19 NOTE — Discharge Summary (Signed)
 Physician Discharge Summary  Rhonda Hawkins FMW:[id number] DOB: [birth year] DOA: 06/17/2024  PCP: Carol Catalan, PA-C  Admit date: 06/17/2024 Discharge date: 06/19/2024  Admitted From: home Disposition:  home Recommendations for Outpatient Follow-up:  Follow up with PCP in 1-2 weeks Please obtain BMP/CBC in one week Please follow up with dr watt  Home Health:no Equipment/Devices:none Discharge Condition: Stable CODE STATUS: Full code Diet recommendation: Cardiac  Brief/Interim Summary: 48 y.o. female with medical history significant of unspecified anemia, type 2 diabetes, hypothyroidism, hyperlipidemia allergic rhinitis, complicated grieving, mixed anxiety and depressive disorder, dental abscess, viral URI, vaginal bleeding, vitamin D deficiency, history of UTI who presented to the emergency department with complaints of right flank pain a for the past 3 days associated ssociated with mild suprapubic tenderness, cloudy urine, 2 episodes of emesis the first day and persistent nausea since then with decreased appetite.  She denied fever, but has had chills and night sweats.  No rhinorrhea, sore throat, wheezing or hemoptysis.  No chest pain, palpitations, diaphoresis, PND, orthopnea or pitting edema of the lower extremities.  No diarrhea, constipation, melena or hematochezia.  No frequency or hematuria.  No polyuria, polydipsia, polyphagia or blurred vision.    Lab work: Urinalysis showed  glucose greater than 500 and ketones of 20 mg/dL, moderate leukocyte esterase, 6-10 RBC, greater than 50 WBC, rare bacteria and positive WBC clumps.  CBC showed white count of 9.6, hemoglobin 13.1 g/dL and platelets 848.  Lipase was 90 units/L.  CMP shows sodium 128, potassium 4.1, chloride 94 and CO2 21 mmol/L with a normal anion gap.  Glucose level was 514 mg/dL, renal function, calcium level and the LFTs were normal.   Imaging: CT abdomen/pelvis with contrast shows small stones in the  proximal right ureter with mild right hydronephrosis.  Ureteral wall enhancement, concerning for superimposed infection/pyelonephritis.  Discharge Diagnoses:  Principal Problem:   Pyelonephritis of right kidney Active Problems:   Hyperlipidemia   Hypothyroidism   Pseudohyponatremia   Type 2 diabetes mellitus with hyperglycemia (HCC)   Hydronephrosis of right kidney   Urolithiasis   Elevated lipase    Right urolithiasis with hydronephrosis and pyelonephritis of the right kidney status post cystoscopy with insertion of right double-J stent, Foley was taken out on the 29th.  Patient urinating without difficulty since then.  Urine culture with E. coli pansensitive.  She was treated with Rocephin in the hospital.  She is discharged on Keflex for 7 days and then Keflex to 50 mg nightly.  She will follow-up with Dr. Watt.   Type 2 diabetes uncontrolled with hyperglycemia-hemoglobin A1c pending on dc CBG (last 3)  Recent Labs (last 2 labs)       Recent Labs    06/17/24 2046 06/18/24 0739 06/18/24 1207  GLUCAP 199* 186* 179*      She is not on any therapy at home for diabetes.     Hyperlipidemia not on any statins prior to admission   Hypothyroidism not on replacement.  No TSH on record.   Pseudohyponatremia secondary to hyperglycemia resolved   Elevated lipase with normal-appearing pancreas by CT Continue supportive measures   Estimated body mass index is 21.92 kg/m as calculated from the following:   Height as of this encounter: 5' 1 (1.549 m).   Weight as of this encounter: 52.6 kg.  Discharge Instructions  Discharge Instructions     Diet - low sodium heart healthy   Complete by: As directed    Increase activity slowly   Complete by:  As directed       Allergies as of 06/19/2024       Reactions   Metformin  Diarrhea        Medication List     STOP taking these medications    docusate sodium  100 MG capsule Commonly known as: COLACE   naproxen  375 MG  tablet Commonly known as: NAPROSYN    ondansetron  4 MG tablet Commonly known as: ZOFRAN    potassium chloride  SA 20 MEQ tablet Commonly known as: KLOR-CON  M   traMADol  50 MG tablet Commonly known as: ULTRAM        TAKE these medications    cephALEXin 500 MG capsule Commonly known as: KEFLEX Take 1 capsule (500 mg total) by mouth every 6 (six) hours for 7 days.   cephALEXin 250 MG capsule Commonly known as: KEFLEX Take 1 capsule (250 mg total) by mouth at bedtime. Start on December 8th once finished with 500 mg   ibuprofen  200 MG tablet Commonly known as: ADVIL  Take 600 mg by mouth daily as needed for mild pain (pain score 1-3) or moderate pain (pain score 4-6). What changed: Another medication with the same name was removed. Continue taking this medication, and follow the directions you see here.   TYLENOL  PO Take 2 tablets by mouth daily as needed (pain).        Follow-up Information     Watt Rush, MD .   Specialty: Urology Contact information: [address] [place] [address] [phone]         [facility] Follow up.   Why: My office will call to arrange f/u and the next procedure. Contact information: [address] [place]  [address] [phone]        [place], [place], PA-C Follow up.   Specialty: Physician Assistant Contact information: [address] [place] [address] [phone]                Allergies  Allergen Reactions   Metformin  Diarrhea    Consultations:urology   Procedures/Studies: DG C-Arm 1-60 Min-No Report Result Date: 06/17/2024 Fluoroscopy was utilized by the requesting physician.  No radiographic interpretation.   CT ABDOMEN PELVIS W CONTRAST Result Date: 06/17/2024 EXAM: CT ABDOMEN AND PELVIS WITH CONTRAST 06/17/2024 03:21:31 AM TECHNIQUE: CT of the abdomen and pelvis was performed with the administration of 100 mL of iohexol (OMNIPAQUE) 300 MG/ML solution.  Multiplanar reformatted images are provided for review. Automated exposure control, iterative reconstruction, and/or weight-based adjustment of the mA/kV was utilized to reduce the radiation dose to as low as reasonably achievable. COMPARISON: 07/05/2018 CLINICAL HISTORY: RLQ abdominal pain. FINDINGS: LOWER CHEST: No acute abnormality. LIVER: The liver is unremarkable. GALLBLADDER AND BILE DUCTS: Gallbladder is unremarkable. No biliary ductal dilatation. SPLEEN: No acute abnormality. PANCREAS: No acute abnormality. ADRENAL GLANDS: No acute abnormality. KIDNEYS, URETERS AND BLADDER: A few small stones are noted in the proximal right ureter measuring up to 2 mm with mild right hydronephrosis. Below the stones, the right ureter remains mildly prominent and there appears to be enhancement of the ureteral wall. Cannot exclude infection/pyelonephritis. No stones in the left kidney or ureter. No left hydronephrosis. No perinephric or periureteral stranding. Urinary bladder is unremarkable. GI AND BOWEL: Stomach demonstrates no acute abnormality. There is no bowel obstruction. Normal appendix. PERITONEUM AND RETROPERITONEUM: No ascites. No free air. VASCULATURE: Aorta is normal in caliber. LYMPH NODES: No lymphadenopathy. REPRODUCTIVE ORGANS: No acute abnormality. BONES AND SOFT TISSUES: No acute osseous abnormality.  No focal soft tissue abnormality. IMPRESSION: 1. Small stones in the proximal right ureter with mild right hydronephrosis. Ureteral wall enhancement, concerning for superimposed infection/pyelonephritis. Electronically signed by: Franky Crease MD 06/17/2024 03:32 AM EST RP Workstation: [id number]   (Echo, Carotid, EGD, Colonoscopy, ERCP)    Subjective:  Anxious to go home Discharge Exam: Vitals:   06/19/24 0416 06/19/24 1245  BP: 125/77 119/76  Pulse: 94 99  Resp: 18   Temp: 98.5 F (36.9 C) 98.5 F (36.9 C)  SpO2:     Vitals:   06/18/24 0430 06/18/24 1210 06/19/24 0416 06/19/24 1245  BP: (!)  117/59 (!) 95/58 125/77 119/76  Pulse: 88 97 94 99  Resp: 15 18 18    Temp: 97.9 F (36.6 C) 99 F (37.2 C) 98.5 F (36.9 C) 98.5 F (36.9 C)  TempSrc:   Oral Oral  SpO2: 98% 98%    Weight:      Height:        General: Pt is alert, awake, not in acute distress Cardiovascular: RRR, S1/S2 +, no rubs, no gallops Respiratory: CTA bilaterally, no wheezing, no rhonchi Abdominal: Soft, NT, ND, bowel sounds + Extremities: no edema, no cyanosis    The results of significant diagnostics from this hospitalization (including imaging, microbiology, ancillary and laboratory) are listed below for reference.     Microbiology: Recent Results (from the past 240 hours)  Urine Culture     Status: Abnormal   Collection Time: 06/17/24  3:57 AM   Specimen: Urine, Clean Catch  Result Value Ref Range Status   Specimen Description   Final    URINE, CLEAN CATCH Performed at [facility], [address] [place], [place] [address]    Special Requests   Final    NONE Performed at [facility], [address], [place], [place] [address]    Culture >=100,000 COLONIES/mL ESCHERICHIA COLI (A)  Final   Report Status 06/19/2024 FINAL  Final   Organism ID, Bacteria ESCHERICHIA COLI (A)  Final      Susceptibility   Escherichia coli - MIC*    AMPICILLIN 4 SENSITIVE Sensitive     CEFAZOLIN  (URINE) Value in next row Sensitive      <=1 SENSITIVEThis is a modified FDA-approved test that has been validated and its performance characteristics determined by the reporting laboratory.  This laboratory is certified under the Clinical Laboratory Improvement Amendments CLIA as qualified to perform high complexity clinical laboratory testing.    CEFEPIME Value in next row Sensitive      <=1 SENSITIVEThis is a modified FDA-approved test that has been validated and its performance characteristics determined by the reporting laboratory.  This laboratory is certified under the Clinical Laboratory Improvement Amendments CLIA  as qualified to perform high complexity clinical laboratory testing.    ERTAPENEM Value in next row Sensitive      <=1 SENSITIVEThis is a modified FDA-approved test that has been validated and its performance characteristics determined by the reporting laboratory.  This laboratory is certified under the Clinical Laboratory Improvement Amendments CLIA as qualified to perform high complexity clinical laboratory testing.    CEFTRIAXONE Value in next row Sensitive      <=1 SENSITIVEThis is a modified FDA-approved test that has been validated and its performance characteristics determined by the reporting laboratory.  This laboratory is certified under the Clinical Laboratory Improvement Amendments CLIA as qualified to perform high complexity clinical laboratory testing.    CIPROFLOXACIN Value in next row Sensitive      <=1  SENSITIVEThis is a modified FDA-approved test that has been validated and its performance characteristics determined by the reporting laboratory.  This laboratory is certified under the Clinical Laboratory Improvement Amendments CLIA as qualified to perform high complexity clinical laboratory testing.    GENTAMICIN Value in next row Sensitive      <=1 SENSITIVEThis is a modified FDA-approved test that has been validated and its performance characteristics determined by the reporting laboratory.  This laboratory is certified under the Clinical Laboratory Improvement Amendments CLIA as qualified to perform high complexity clinical laboratory testing.    NITROFURANTOIN Value in next row Sensitive      <=1 SENSITIVEThis is a modified FDA-approved test that has been validated and its performance characteristics determined by the reporting laboratory.  This laboratory is certified under the Clinical Laboratory Improvement Amendments CLIA as qualified to perform high complexity clinical laboratory testing.    TRIMETH/SULFA Value in next row Sensitive      <=1 SENSITIVEThis is a modified  FDA-approved test that has been validated and its performance characteristics determined by the reporting laboratory.  This laboratory is certified under the Clinical Laboratory Improvement Amendments CLIA as qualified to perform high complexity clinical laboratory testing.    AMPICILLIN/SULBACTAM Value in next row Sensitive      <=1 SENSITIVEThis is a modified FDA-approved test that has been validated and its performance characteristics determined by the reporting laboratory.  This laboratory is certified under the Clinical Laboratory Improvement Amendments CLIA as qualified to perform high complexity clinical laboratory testing.    PIP/TAZO Value in next row Sensitive      <=4 SENSITIVEThis is a modified FDA-approved test that has been validated and its performance characteristics determined by the reporting laboratory.  This laboratory is certified under the Clinical Laboratory Improvement Amendments CLIA as qualified to perform high complexity clinical laboratory testing.    MEROPENEM Value in next row Sensitive      <=4 SENSITIVEThis is a modified FDA-approved test that has been validated and its performance characteristics determined by the reporting laboratory.  This laboratory is certified under the Clinical Laboratory Improvement Amendments CLIA as qualified to perform high complexity clinical laboratory testing.    * >=100,000 COLONIES/mL ESCHERICHIA COLI  Culture, blood (routine x 2)     Status: None (Preliminary result)   Collection Time: 06/17/24  8:28 AM   Specimen: BLOOD  Result Value Ref Range Status   Specimen Description BLOOD BLOOD RIGHT FOREARM  Final   Special Requests   Final    BOTTLES DRAWN AEROBIC AND ANAEROBIC Blood Culture adequate volume   Culture   Final    NO GROWTH 2 DAYS Performed at [facility], [address], [place], [place] [address]    Report Status PENDING  Incomplete  Culture, blood (routine x 2)     Status: None (Preliminary result)   Collection Time:  06/17/24  8:28 AM   Specimen: BLOOD  Result Value Ref Range Status   Specimen Description BLOOD BLOOD RIGHT HAND  Final   Special Requests   Final    BOTTLES DRAWN AEROBIC AND ANAEROBIC Blood Culture adequate volume   Culture   Final    NO GROWTH 2 DAYS Performed at [facility], [address] [place], [place] [address]    Report Status PENDING  Incomplete     Labs: BNP (last 3 results) No results for input(s): BNP in the last 8760 hours. Basic Metabolic Panel: Recent Labs  Lab 06/17/24 0150 06/18/24 0514  NA 128* 135  K 4.1 3.5  CL 94* 103  CO2 21* 17*  GLUCOSE 514* 174*  BUN 11 7  CREATININE 0.69 0.55  CALCIUM 8.9 8.0*   Liver Function Tests: Recent Labs  Lab 06/17/24 0150 06/18/24 0514  AST 16 13*  ALT 27 17  ALKPHOS 88 53  BILITOT 1.0 0.5  PROT 7.6 6.5  ALBUMIN 3.7 3.0*   Recent Labs  Lab 06/17/24 0150  LIPASE 90*   No results for input(s): AMMONIA in the last 168 hours. CBC: Recent Labs  Lab 06/17/24 0150 06/18/24 0514  WBC 9.6 7.8  NEUTROABS 6.9  --   HGB 13.1 11.5*  HCT 37.8 34.5*  MCV 85.9 88.9  PLT 151 150   Cardiac Enzymes: No results for input(s): CKTOTAL, CKMB, CKMBINDEX, TROPONINI in the last 168 hours. BNP: Invalid input(s): POCBNP CBG: Recent Labs  Lab 06/18/24 1207 06/18/24 1649 06/18/24 2036 06/19/24 0728 06/19/24 1213  GLUCAP 179* 255* 204* 312* 294*   D-Dimer No results for input(s): DDIMER in the last 72 hours. Hgb A1c No results for input(s): HGBA1C in the last 72 hours. Lipid Profile Recent Labs    06/19/24 0535  CHOL 119  HDL 24*  LDLCALC 62  TRIG 835*  CHOLHDL 4.9   Thyroid function studies Recent Labs    06/18/24 1545  TSH 1.560   Anemia work up No results for input(s): VITAMINB12, FOLATE, FERRITIN, TIBC, IRON, RETICCTPCT in the last 72 hours. Urinalysis    Component Value Date/Time   COLORURINE STRAW (A) 06/17/2024 0357   APPEARANCEUR CLOUDY (A) 06/17/2024 0357    LABSPEC >1.046 (H) 06/17/2024 0357   PHURINE 5.0 06/17/2024 0357   GLUCOSEU >=500 (A) 06/17/2024 0357   HGBUR NEGATIVE 06/17/2024 0357   BILIRUBINUR NEGATIVE 06/17/2024 0357   KETONESUR 20 (A) 06/17/2024 0357   PROTEINUR NEGATIVE 06/17/2024 0357   UROBILINOGEN 0.2 10/10/2014 1025   NITRITE NEGATIVE 06/17/2024 0357   LEUKOCYTESUR MODERATE (A) 06/17/2024 0357   Sepsis Labs Recent Labs  Lab 06/17/24 0150 06/18/24 0514  WBC 9.6 7.8   Microbiology Recent Results (from the past 240 hours)  Urine Culture     Status: Abnormal   Collection Time: 06/17/24  3:57 AM   Specimen: Urine, Clean Catch  Result Value Ref Range Status   Specimen Description   Final    URINE, CLEAN CATCH Performed at [facility], [address], [place], [place] [address]    Special Requests   Final    NONE Performed at [facility], [address] [place], [place] [address]    Culture >=100,000 COLONIES/mL ESCHERICHIA COLI (A)  Final   Report Status 06/19/2024 FINAL  Final   Organism ID, Bacteria ESCHERICHIA COLI (A)  Final      Susceptibility   Escherichia coli - MIC*    AMPICILLIN 4 SENSITIVE Sensitive     CEFAZOLIN  (URINE) Value in next row Sensitive      <=1 SENSITIVEThis is a modified FDA-approved test that has been validated and its performance characteristics determined by the reporting laboratory.  This laboratory is certified under the Clinical Laboratory Improvement Amendments CLIA as qualified to perform high complexity clinical laboratory testing.    CEFEPIME Value in next row Sensitive      <=1 SENSITIVEThis is a modified FDA-approved test that has been validated and its performance characteristics determined by the reporting laboratory.  This laboratory is certified under the Clinical Laboratory Improvement Amendments CLIA as qualified to perform high complexity clinical laboratory testing.  ERTAPENEM Value in next row Sensitive      <=1 SENSITIVEThis is a modified FDA-approved test that has  been validated and its performance characteristics determined by the reporting laboratory.  This laboratory is certified under the Clinical Laboratory Improvement Amendments CLIA as qualified to perform high complexity clinical laboratory testing.    CEFTRIAXONE Value in next row Sensitive      <=1 SENSITIVEThis is a modified FDA-approved test that has been validated and its performance characteristics determined by the reporting laboratory.  This laboratory is certified under the Clinical Laboratory Improvement Amendments CLIA as qualified to perform high complexity clinical laboratory testing.    CIPROFLOXACIN Value in next row Sensitive      <=1 SENSITIVEThis is a modified FDA-approved test that has been validated and its performance characteristics determined by the reporting laboratory.  This laboratory is certified under the Clinical Laboratory Improvement Amendments CLIA as qualified to perform high complexity clinical laboratory testing.    GENTAMICIN Value in next row Sensitive      <=1 SENSITIVEThis is a modified FDA-approved test that has been validated and its performance characteristics determined by the reporting laboratory.  This laboratory is certified under the Clinical Laboratory Improvement Amendments CLIA as qualified to perform high complexity clinical laboratory testing.    NITROFURANTOIN Value in next row Sensitive      <=1 SENSITIVEThis is a modified FDA-approved test that has been validated and its performance characteristics determined by the reporting laboratory.  This laboratory is certified under the Clinical Laboratory Improvement Amendments CLIA as qualified to perform high complexity clinical laboratory testing.    TRIMETH/SULFA Value in next row Sensitive      <=1 SENSITIVEThis is a modified FDA-approved test that has been validated and its performance characteristics determined by the reporting laboratory.  This laboratory is certified under the Clinical Laboratory  Improvement Amendments CLIA as qualified to perform high complexity clinical laboratory testing.    AMPICILLIN/SULBACTAM Value in next row Sensitive      <=1 SENSITIVEThis is a modified FDA-approved test that has been validated and its performance characteristics determined by the reporting laboratory.  This laboratory is certified under the Clinical Laboratory Improvement Amendments CLIA as qualified to perform high complexity clinical laboratory testing.    PIP/TAZO Value in next row Sensitive      <=4 SENSITIVEThis is a modified FDA-approved test that has been validated and its performance characteristics determined by the reporting laboratory.  This laboratory is certified under the Clinical Laboratory Improvement Amendments CLIA as qualified to perform high complexity clinical laboratory testing.    MEROPENEM Value in next row Sensitive      <=4 SENSITIVEThis is a modified FDA-approved test that has been validated and its performance characteristics determined by the reporting laboratory.  This laboratory is certified under the Clinical Laboratory Improvement Amendments CLIA as qualified to perform high complexity clinical laboratory testing.    * >=100,000 COLONIES/mL ESCHERICHIA COLI  Culture, blood (routine x 2)     Status: None (Preliminary result)   Collection Time: 06/17/24  8:28 AM   Specimen: BLOOD  Result Value Ref Range Status   Specimen Description BLOOD BLOOD RIGHT FOREARM  Final   Special Requests   Final    BOTTLES DRAWN AEROBIC AND ANAEROBIC Blood Culture adequate volume   Culture   Final    NO GROWTH 2 DAYS Performed at [facility], [address], [place], [place] [address]    Report Status PENDING  Incomplete  Culture, blood (routine x  2)     Status: None (Preliminary result)   Collection Time: 06/17/24  8:28 AM   Specimen: BLOOD  Result Value Ref Range Status   Specimen Description BLOOD BLOOD RIGHT HAND  Final   Special Requests   Final    BOTTLES DRAWN AEROBIC AND  ANAEROBIC Blood Culture adequate volume   Culture   Final    NO GROWTH 2 DAYS Performed at [facility], [address], [place], [place] [address]    Report Status PENDING  Incomplete     Time coordinating discharge:38 minutes  SIGNED:   Almarie KANDICE Hoots, MD  [facility] 06/19/2024, 1:23 PM

## 2024-06-19 NOTE — Progress Notes (Signed)
 Urology Inpatient Progress Report  Hyperglycemia [R73.9] Right ureteral stone [N20.1] Pyelonephritis of right kidney [N12]  Procedure(s): CYSTOSCOPY, WITH RETROGRADE PYELOGRAM AND URETERAL STENT INSERTION  2 Days Post-Op   Intv/Subj: No acute events overnight. Patient is without complaint.  Urine culture positive for E. coli that is pansensitive.  No reported antibiotic allergies.  Principal Problem:   Pyelonephritis of right kidney Active Problems:   Hyperlipidemia   Hypothyroidism   Pseudohyponatremia   Type 2 diabetes mellitus with hyperglycemia (HCC)   Hydronephrosis of right kidney   Urolithiasis   Elevated lipase  Current Facility-Administered Medications  Medication Dose Route Frequency Provider Last Rate Last Admin   acetaminophen  (TYLENOL ) tablet 650 mg  650 mg Oral Q6H PRN Celinda Alm Lot, MD   650 mg at 06/19/24 9581   Or   acetaminophen  (TYLENOL ) suppository 650 mg  650 mg Rectal Q6H PRN Celinda Alm Lot, MD       cefTRIAXone (ROCEPHIN) 1 g in sodium chloride  0.9 % 100 mL IVPB  1 g Intravenous Q24H Celinda Alm Lot, MD 200 mL/hr at 06/19/24 0338 1 g at 06/19/24 9661   HYDROmorphone  (DILAUDID ) injection 0.5 mg  0.5 mg Intravenous Once Celinda Alm Lot, MD       insulin  aspart (novoLOG ) injection 0-9 Units  0-9 Units Subcutaneous TID WC Celinda Alm Lot, MD   7 Units at 06/19/24 0848   ketorolac  (TORADOL ) 15 MG/ML injection 15 mg  15 mg Intravenous Once Celinda Alm Lot, MD       ondansetron  (ZOFRAN ) tablet 4 mg  4 mg Oral Q6H PRN Celinda Alm Lot, MD   4 mg at 06/18/24 8177   Or   ondansetron  (ZOFRAN ) injection 4 mg  4 mg Intravenous Q6H PRN Celinda Alm Lot, MD       oxyCODONE  (Oxy IR/ROXICODONE ) immediate release tablet 5 mg  5 mg Oral Q6H PRN Celinda Alm Lot, MD   5 mg at 06/19/24 0418     Objective: Vital: Vitals:   06/17/24 2141 06/18/24 0430 06/18/24 1210 06/19/24 0416  BP: 111/69 (!) 117/59 (!) 95/58 125/77  Pulse: (!) 102  88 97 94  Resp: 15 15 18 18   Temp: 98.3 F (36.8 C) 97.9 F (36.6 C) 99 F (37.2 C) 98.5 F (36.9 C)  TempSrc:    Oral  SpO2: 96% 98% 98%   Weight:      Height:       I/Os: I/O last 3 completed shifts: In: -  Out: 1100 [Urine:1100]  Physical Exam:  General: Patient is in no apparent distress Lungs: Normal respiratory effort, chest expands symmetrically. GI: The abdomen is soft and nontender without mass. Ext: lower extremities symmetric  Lab Results: Recent Labs    06/17/24 0150 06/18/24 0514  WBC 9.6 7.8  HGB 13.1 11.5*  HCT 37.8 34.5*   Recent Labs    06/17/24 0150 06/18/24 0514  NA 128* 135  K 4.1 3.5  CL 94* 103  CO2 21* 17*  GLUCOSE 514* 174*  BUN 11 7  CREATININE 0.69 0.55  CALCIUM 8.9 8.0*   No results for input(s): LABPT, INR in the last 72 hours. No results for input(s): LABURIN in the last 72 hours. Results for orders placed or performed during the hospital encounter of 06/17/24  Urine Culture     Status: Abnormal   Collection Time: 06/17/24  3:57 AM   Specimen: Urine, Clean Catch  Result Value Ref Range Status   Specimen Description   Final  URINE, CLEAN CATCH Performed at [facility], [address], [place], [place] [address]    Special Requests   Final    NONE Performed at [facility], [address] [place], [place] [address]    Culture >=100,000 COLONIES/mL ESCHERICHIA COLI (A)  Final   Report Status 06/19/2024 FINAL  Final   Organism ID, Bacteria ESCHERICHIA COLI (A)  Final      Susceptibility   Escherichia coli - MIC*    AMPICILLIN 4 SENSITIVE Sensitive     CEFAZOLIN  (URINE) Value in next row Sensitive      <=1 SENSITIVEThis is a modified FDA-approved test that has been validated and its performance characteristics determined by the reporting laboratory.  This laboratory is certified under the Clinical Laboratory Improvement Amendments CLIA as qualified to perform high complexity clinical laboratory testing.    CEFEPIME  Value in next row Sensitive      <=1 SENSITIVEThis is a modified FDA-approved test that has been validated and its performance characteristics determined by the reporting laboratory.  This laboratory is certified under the Clinical Laboratory Improvement Amendments CLIA as qualified to perform high complexity clinical laboratory testing.    ERTAPENEM Value in next row Sensitive      <=1 SENSITIVEThis is a modified FDA-approved test that has been validated and its performance characteristics determined by the reporting laboratory.  This laboratory is certified under the Clinical Laboratory Improvement Amendments CLIA as qualified to perform high complexity clinical laboratory testing.    CEFTRIAXONE Value in next row Sensitive      <=1 SENSITIVEThis is a modified FDA-approved test that has been validated and its performance characteristics determined by the reporting laboratory.  This laboratory is certified under the Clinical Laboratory Improvement Amendments CLIA as qualified to perform high complexity clinical laboratory testing.    CIPROFLOXACIN Value in next row Sensitive      <=1 SENSITIVEThis is a modified FDA-approved test that has been validated and its performance characteristics determined by the reporting laboratory.  This laboratory is certified under the Clinical Laboratory Improvement Amendments CLIA as qualified to perform high complexity clinical laboratory testing.    GENTAMICIN Value in next row Sensitive      <=1 SENSITIVEThis is a modified FDA-approved test that has been validated and its performance characteristics determined by the reporting laboratory.  This laboratory is certified under the Clinical Laboratory Improvement Amendments CLIA as qualified to perform high complexity clinical laboratory testing.    NITROFURANTOIN Value in next row Sensitive      <=1 SENSITIVEThis is a modified FDA-approved test that has been validated and its performance characteristics determined by the  reporting laboratory.  This laboratory is certified under the Clinical Laboratory Improvement Amendments CLIA as qualified to perform high complexity clinical laboratory testing.    TRIMETH/SULFA Value in next row Sensitive      <=1 SENSITIVEThis is a modified FDA-approved test that has been validated and its performance characteristics determined by the reporting laboratory.  This laboratory is certified under the Clinical Laboratory Improvement Amendments CLIA as qualified to perform high complexity clinical laboratory testing.    AMPICILLIN/SULBACTAM Value in next row Sensitive      <=1 SENSITIVEThis is a modified FDA-approved test that has been validated and its performance characteristics determined by the reporting laboratory.  This laboratory is certified under the Clinical Laboratory Improvement Amendments CLIA as qualified to perform high complexity clinical laboratory testing.    PIP/TAZO Value in next row Sensitive      <=4 SENSITIVEThis  is a modified FDA-approved test that has been validated and its performance characteristics determined by the reporting laboratory.  This laboratory is certified under the Clinical Laboratory Improvement Amendments CLIA as qualified to perform high complexity clinical laboratory testing.    MEROPENEM Value in next row Sensitive      <=4 SENSITIVEThis is a modified FDA-approved test that has been validated and its performance characteristics determined by the reporting laboratory.  This laboratory is certified under the Clinical Laboratory Improvement Amendments CLIA as qualified to perform high complexity clinical laboratory testing.    * >=100,000 COLONIES/mL ESCHERICHIA COLI  Culture, blood (routine x 2)     Status: None (Preliminary result)   Collection Time: 06/17/24  8:28 AM   Specimen: BLOOD  Result Value Ref Range Status   Specimen Description BLOOD BLOOD RIGHT FOREARM  Final   Special Requests   Final    BOTTLES DRAWN AEROBIC AND ANAEROBIC Blood  Culture adequate volume   Culture   Final    NO GROWTH 2 DAYS Performed at [facility], [address] [place], [place] [address]    Report Status PENDING  Incomplete  Culture, blood (routine x 2)     Status: None (Preliminary result)   Collection Time: 06/17/24  8:28 AM   Specimen: BLOOD  Result Value Ref Range Status   Specimen Description BLOOD BLOOD RIGHT HAND  Final   Special Requests   Final    BOTTLES DRAWN AEROBIC AND ANAEROBIC Blood Culture adequate volume   Culture   Final    NO GROWTH 2 DAYS Performed at [facility], [address], [place], [place] [address]    Report Status PENDING  Incomplete    Studies/Results: DG C-Arm 1-60 Min-No Report Result Date: 06/17/2024 Fluoroscopy was utilized by the requesting physician.  No radiographic interpretation.    Assessment: Right ureteral calculus status post right ureteral stent 11/28 Urinary tract infection  Procedure(s): CYSTOSCOPY, WITH RETROGRADE PYELOGRAM AND URETERAL STENT INSERTION, 2 Days Post-Op  doing well.  Plan: Okay for discharge with 7 days of full dose cephalexin.  She may then start 250 mg nightly Keflex for UTI prophylaxis.   Sherwood Edison, MD Urology 06/19/2024, 11:53 AM

## 2024-06-22 LAB — CULTURE, BLOOD (ROUTINE X 2)
Culture: NO GROWTH
Culture: NO GROWTH
Special Requests: ADEQUATE
Special Requests: ADEQUATE

## 2024-06-27 ENCOUNTER — Other Ambulatory Visit: Payer: Self-pay | Admitting: Urology

## 2024-06-29 ENCOUNTER — Ambulatory Visit: Payer: Self-pay | Admitting: Orthopedic Surgery

## 2024-07-27 NOTE — Congregational Nurse Program (Signed)
 [facility] client came into [place] today for assistance applying for CAFA with Rhonda Hawkins Care Guide.  Client is current with [facility] and was last seen in Sept 2025, Next appointment is 08/10/24 at 10 am Client known to this RN as she is uncontrolled type 2 Diabetic. Last A1C was 9.8(06/18/24)   She is scheduled for Cystoscopy with possible stent placement or stone removal of Kidney Stoes on 08/09/24 with Dr. Watt. She reports no pain today, but has had a lot of pain recently and bladder infections.  We discussed how infection, stress can affect blood sugars. She reports that with her pain and lack of sleep she has not been watching her diet as she should nor exercising. She does not check her blood sugars and does not know how.  We discussed that after her surgery we will do a follow up call to check and if she would like we can schedule her to come in and we can teach her how to monitor her blood sugars and review diet. She has had a bilingual diet education class in the past with this RN/ [employer] [facility] interpreter and bilingual diet educator. At one time she enrolled in a [employer] grant funded scholarship program for uncontrolled tyep 2 diabetics in partnership with [facility], but at the time she was unable to be active. She expresses interests today to return. I have discussed that I am unsure of the funding left available in that grant, but that I would check and when we follow up I will have more information in regards to that program. She is agreeable to all of the above.  Discussed the importance for healing after surgery to attempt to follow a diabetic diet to keep her blood sugars under control better. She reports she currently has all over her medications and is taking them as directed.  Recommended that her appointment to [facility] is day after her surgery and if she feels she cannot go to that appointment to call and reschedule and let them know if she low on medications as to  not run out.  She states understanding. She will now establish care with a new provider [facility] as her previous provider is no longer available.  Plan to continue to follow, no other questions today nor needs at this time.  Rhonda JONELLE Skeen RN [facility]  [facility]

## 2024-08-08 NOTE — Progress Notes (Signed)
 Spoke w/ via phone for pre-op interview---through spanish interpreter Millard (Cook) with [employer] needs dos---- bmp cbg        Lab results------ COVID test -----patient states asymptomatic no test needed Arrive at -------0700 NPO after MN NO Solid Food.  Clear liquids from MN until---na Pre-Surgery Ensure or G2:  Med rec completed Medications to take morning of surgery -----tylenol  if needed for pain Diabetic medication -----  GLP1 agonist last dose: GLP1 instructions:  Patient instructed no nail polish to be worn day of surgery Patient instructed to bring photo id and insurance card day of surgery Patient aware to have Driver (ride ) / caregiver    for 24 hours after surgery - Son Marsa Lye [phone] Patient Special Instructions ----- Pre-Op special Instructions -----  Patient verbalized understanding of instructions that were given at this phone interview. Patient denies chest pain, sob, fever, cough at the interview.

## 2024-08-09 ENCOUNTER — Ambulatory Visit (HOSPITAL_COMMUNITY)
Admission: RE | Admit: 2024-08-09 | Discharge: 2024-08-09 | Disposition: A | Discharge status: Home / Self Care | Payer: MEDICAID | Attending: Urology | Admitting: Urology

## 2024-08-09 ENCOUNTER — Ambulatory Visit (HOSPITAL_COMMUNITY): Payer: Self-pay | Admitting: Anesthesiology

## 2024-08-09 ENCOUNTER — Encounter (HOSPITAL_COMMUNITY): Payer: Self-pay | Admitting: Urology

## 2024-08-09 ENCOUNTER — Encounter (HOSPITAL_COMMUNITY)
Admission: RE | Disposition: A | Discharge status: Home / Self Care | Payer: Self-pay | Source: Home / Self Care | Attending: Urology

## 2024-08-09 ENCOUNTER — Ambulatory Visit (HOSPITAL_COMMUNITY): Payer: Self-pay

## 2024-08-09 DIAGNOSIS — E039 Hypothyroidism, unspecified: Secondary | ICD-10-CM | POA: Insufficient documentation

## 2024-08-09 DIAGNOSIS — Z01818 Encounter for other preprocedural examination: Secondary | ICD-10-CM

## 2024-08-09 DIAGNOSIS — E119 Type 2 diabetes mellitus without complications: Secondary | ICD-10-CM | POA: Insufficient documentation

## 2024-08-09 DIAGNOSIS — N201 Calculus of ureter: Secondary | ICD-10-CM

## 2024-08-09 HISTORY — PX: CYSTOSCOPY/URETEROSCOPY/HOLMIUM LASER/STENT PLACEMENT: SHX6546

## 2024-08-09 LAB — GLUCOSE, CAPILLARY
Glucose-Capillary: 355 mg/dL — ABNORMAL HIGH (ref 70–99)
Glucose-Capillary: 245 mg/dL — ABNORMAL HIGH (ref 70–99)

## 2024-08-09 LAB — BASIC METABOLIC PANEL WITH GFR
Anion gap: 18 — ABNORMAL HIGH (ref 5–15)
BUN: 10 mg/dL (ref 6–20)
CO2: 16 mmol/L — ABNORMAL LOW (ref 22–32)
Calcium: 8.7 mg/dL — ABNORMAL LOW (ref 8.9–10.3)
Chloride: 100 mmol/L (ref 98–111)
Creatinine, Ser: 0.66 mg/dL (ref 0.44–1.00)
GFR, Estimated: >60 mL/min
Glucose, Bld: 402 mg/dL — ABNORMAL HIGH (ref 70–99)
Potassium: 3.9 mmol/L (ref 3.5–5.1)
Sodium: 133 mmol/L — ABNORMAL LOW (ref 135–145)

## 2024-08-09 MED ORDER — KETOROLAC TROMETHAMINE 30 MG/ML IJ SOLN: 30.0000 mg | Freq: Once | INTRAMUSCULAR | Status: DC | PRN

## 2024-08-09 MED ORDER — SODIUM CHLORIDE 0.9 % IR SOLN
Status: DC | PRN
  Administered 2024-08-09: 3000 mL

## 2024-08-09 MED ORDER — KETOROLAC TROMETHAMINE 30 MG/ML IJ SOLN
INTRAMUSCULAR | Status: DC | PRN
  Administered 2024-08-09: 30 mg via INTRAVENOUS

## 2024-08-09 MED ORDER — CHLORHEXIDINE GLUCONATE 0.12 % MT SOLN
15.0000 mL | Freq: Once | OROMUCOSAL | Status: AC
  Administered 2024-08-09: 15 mL via OROMUCOSAL

## 2024-08-09 MED ORDER — LIDOCAINE 2% (20 MG/ML) 5 ML SYRINGE
INTRAMUSCULAR | Status: AC
  Filled 2024-08-09: qty 5

## 2024-08-09 MED ORDER — HYDROMORPHONE HCL 1 MG/ML IJ SOLN: 0.2500 mg | INTRAMUSCULAR | Status: DC | PRN

## 2024-08-09 MED ORDER — PROPOFOL 10 MG/ML IV BOLUS
INTRAVENOUS | Status: DC | PRN
  Administered 2024-08-09: 200 mg via INTRAVENOUS

## 2024-08-09 MED ORDER — PHENYLEPHRINE HCL-NACL 20-0.9 MG/250ML-% IV SOLN
INTRAVENOUS | Status: DC | PRN
  Administered 2024-08-09: 30 ug/min via INTRAVENOUS

## 2024-08-09 MED ORDER — LACTATED RINGERS IV SOLN: INTRAVENOUS | Status: DC | PRN

## 2024-08-09 MED ORDER — SODIUM CHLORIDE 0.9 % IV SOLN
2.0000 g | INTRAVENOUS | Status: AC
  Administered 2024-08-09: 2 g via INTRAVENOUS
  Filled 2024-08-09: qty 20

## 2024-08-09 MED ORDER — AMISULPRIDE (ANTIEMETIC) 5 MG/2ML IV SOLN: 10.0000 mg | Freq: Once | INTRAVENOUS | Status: DC | PRN

## 2024-08-09 MED ORDER — KETOROLAC TROMETHAMINE 30 MG/ML IJ SOLN
INTRAMUSCULAR | Status: AC
  Filled 2024-08-09: qty 1

## 2024-08-09 MED ORDER — OXYCODONE HCL 5 MG PO TABS: 5.0000 mg | ORAL_TABLET | Freq: Once | ORAL | Status: DC | PRN

## 2024-08-09 MED ORDER — FENTANYL CITRATE (PF) 100 MCG/2ML IJ SOLN
INTRAMUSCULAR | Status: DC | PRN
  Administered 2024-08-09: 100 ug via INTRAVENOUS

## 2024-08-09 MED ORDER — ACETAMINOPHEN 500 MG PO TABS
ORAL_TABLET | ORAL | Status: AC
  Filled 2024-08-09: qty 2

## 2024-08-09 MED ORDER — SCOPOLAMINE 1 MG/3DAYS TD PT72
1.0000 | MEDICATED_PATCH | TRANSDERMAL | Status: DC
  Administered 2024-08-09: 1 mg via TRANSDERMAL

## 2024-08-09 MED ORDER — MIDAZOLAM HCL 5 MG/5ML IJ SOLN
INTRAMUSCULAR | Status: DC | PRN
  Administered 2024-08-09: 2 mg via INTRAVENOUS

## 2024-08-09 MED ORDER — SODIUM CHLORIDE 0.9 % IV SOLN: INTRAVENOUS | Status: DC

## 2024-08-09 MED ORDER — SCOPOLAMINE 1 MG/3DAYS TD PT72
MEDICATED_PATCH | TRANSDERMAL | Status: AC
  Filled 2024-08-09: qty 1

## 2024-08-09 MED ORDER — MEPERIDINE HCL 25 MG/ML IJ SOLN: 6.2500 mg | INTRAMUSCULAR | Status: DC | PRN

## 2024-08-09 MED ORDER — MIDAZOLAM HCL 2 MG/2ML IJ SOLN
INTRAMUSCULAR | Status: AC
  Filled 2024-08-09: qty 2

## 2024-08-09 MED ORDER — SULFAMETHOXAZOLE-TRIMETHOPRIM 800-160 MG PO TABS: 1.0000 | ORAL_TABLET | Freq: Two times a day (BID) | ORAL | 0 refills | Status: AC

## 2024-08-09 MED ORDER — VITAMIN D 50 MCG (2000 UT) PO TABS: 2000.0000 [IU] | ORAL_TABLET | Freq: Every day | ORAL | Status: AC

## 2024-08-09 MED ORDER — ONDANSETRON HCL 4 MG/2ML IJ SOLN
INTRAMUSCULAR | Status: AC
  Filled 2024-08-09: qty 2

## 2024-08-09 MED ORDER — SODIUM CHLORIDE 0.9% FLUSH: 3.0000 mL | Freq: Two times a day (BID) | INTRAVENOUS | Status: DC

## 2024-08-09 MED ORDER — INSULIN ASPART 100 UNIT/ML IJ SOLN
0.0000 [IU] | INTRAMUSCULAR | Status: DC | PRN
  Administered 2024-08-09: 12 [IU] via SUBCUTANEOUS

## 2024-08-09 MED ORDER — LIDOCAINE 2% (20 MG/ML) 5 ML SYRINGE
INTRAMUSCULAR | Status: DC | PRN
  Administered 2024-08-09: 60 mg via INTRAVENOUS

## 2024-08-09 MED ORDER — OXYCODONE-ACETAMINOPHEN 5-325 MG PO TABS: 1.0000 | ORAL_TABLET | Freq: Three times a day (TID) | ORAL | 0 refills | Status: AC | PRN

## 2024-08-09 MED ORDER — ACETAMINOPHEN 500 MG PO TABS
1000.0000 mg | ORAL_TABLET | Freq: Once | ORAL | Status: AC
  Administered 2024-08-09: 1000 mg via ORAL

## 2024-08-09 MED ORDER — ORAL CARE MOUTH RINSE: 15.0000 mL | Freq: Once | OROMUCOSAL | Status: AC

## 2024-08-09 MED ORDER — OXYCODONE HCL 5 MG/5ML PO SOLN: 5.0000 mg | Freq: Once | ORAL | Status: DC | PRN

## 2024-08-09 MED ORDER — ONDANSETRON HCL 4 MG/2ML IJ SOLN: 4.0000 mg | Freq: Once | INTRAMUSCULAR | Status: DC | PRN

## 2024-08-09 MED ORDER — INSULIN ASPART 100 UNIT/ML IJ SOLN
INTRAMUSCULAR | Status: AC
  Filled 2024-08-09: qty 12

## 2024-08-09 MED ORDER — CHLORHEXIDINE GLUCONATE 0.12 % MT SOLN
OROMUCOSAL | Status: AC
  Filled 2024-08-09: qty 15

## 2024-08-09 MED ORDER — FENTANYL CITRATE (PF) 100 MCG/2ML IJ SOLN
INTRAMUSCULAR | Status: AC
  Filled 2024-08-09: qty 2

## 2024-08-09 MED ORDER — ONDANSETRON HCL 4 MG/2ML IJ SOLN
INTRAMUSCULAR | Status: DC | PRN
  Administered 2024-08-09: 4 mg via INTRAVENOUS

## 2024-08-09 MED ORDER — PHENYLEPHRINE 80 MCG/ML (10ML) SYRINGE FOR IV PUSH (FOR BLOOD PRESSURE SUPPORT)
PREFILLED_SYRINGE | INTRAVENOUS | Status: DC | PRN
  Administered 2024-08-09 (×2): 160 ug via INTRAVENOUS
  Administered 2024-08-09: 120 ug via INTRAVENOUS

## 2024-08-09 NOTE — Op Note (Signed)
 Procedure: 1.  Cystoscopy with removal of right double-J stent. 2.  Right ureteroscopy with stone extraction. 3.  Application of fluoroscopy.  Pre-op diagnosis: Right ureteral stones.  Postop diagnosis: Same.  Surgeon: Dr. Norleen Seltzer.  Anesthesia: General.  Specimen: Stone fragments given to family.  EBL: None.  Drains: None.  Complications: None.  Indications: The patient is a 49 year old female who had an admission for right pyelonephritis and was found to have 2 small right proximal ureteral stones that required stenting on 06/17/2025.  She returns now for stone management.  Procedure: She was taken the operating room where she was given Rocephin .  A general anesthetic was induced.  She was placed in the lithotomy position and food PSIs.  Perineum and genitalia were prepped Betadine  solution she was draped in usual sterile fashion.  Cystoscopy was performed using the 21 French scope and 30 degree lens.  Examination revealed a normal urethra.  The bladder was smooth and pale without tumors, stones or inflammation.  The left ureteral orifice was unremarkable.  The right ureteral orifice had a stent loop with mild encrustation present.  The stent loop was grasped with a grasping forceps and pulled the urethral meatus and the encrusted portion was removed and a guidewire was advanced the kidney under fluoroscopic guidance.  A sensor guidewire was used.  A single-lumen semirigid ureteroscope was then advanced alongside the wire to the mid ureter and the stone was not seen but I could not get it any farther.  I then passed a 28 cm 11/13 digital access sheath over the wire and removed the inner core and wire.  The dual-lumen digital flexible scope was then advanced through the sheath into the proximal ureter and once again I did not see obvious stones but there was a little bit of grit as if the stone had been fragmented in the proximal ureter.  Examination the collecting system of the kidney  demonstrated no stones in the renal pelvis or the calyces.  The ureteroscope was then backed out along with the sheath inspecting the ureter and in the distal ureter I did notice 2 small stones.  The flexible scope was replaced with the semirigid scope and the 2 stones were removed with an engage basket.  Final inspection demonstrated no significant ureteral trauma so was not felt that stents needed replacement since her ureters were widely dilated from prior stenting.  Her bladder was then drained.  She was taken down from lithotomy position, her anesthetic was reversed and she was moved to recovery in stable condition.  There were no complications.

## 2024-08-09 NOTE — Discharge Instructions (Addendum)
 CYSTOSCOPY HOME CARE INSTRUCTIONS  Activity: Rest for the remainder of the day.  Do not drive or operate equipment today.  You may resume normal activities in one to two days as instructed by your physician.   Meals: Drink plenty of liquids and eat light foods such as gelatin or soup this evening.  You may return to a normal meal plan tomorrow.  Return to Work: You may return to work in one to two days or as instructed by your physician.  Special Instructions / Symptoms: Call your physician if any of these symptoms occur:   -persistent or heavy bleeding  -bleeding which continues after first few urination  -large blood clots that are difficult to pass  -urine stream diminishes or stops completely  -fever equal to or higher than 101 degrees Farenheit.  -cloudy urine with a strong, foul odor  -severe pain  Please bring the stones to the office to be sent for analysis.          No acetaminophen /Tylenol  until after 2:00 pm today if needed.  No ibuprofen , Advil , Aleve , Motrin , ketorolac , meloxicam, naproxen , or other NSAIDS until after 4:19 pm today if needed.

## 2024-08-09 NOTE — Anesthesia Procedure Notes (Signed)
 Procedure Name: LMA Insertion Date/Time: 08/09/2024 9:43 AM  Performed by: Denton Niels CROME, CRNAPre-anesthesia Checklist: Patient identified, Emergency Drugs available, Suction available, Patient being monitored and Timeout performed Patient Re-evaluated:Patient Re-evaluated prior to induction Oxygen Delivery Method: Circle system utilized Preoxygenation: Pre-oxygenation with 100% oxygen Induction Type: IV induction Ventilation: Mask ventilation without difficulty LMA: LMA with gastric port inserted LMA Size: 4.0 and 3.0 Number of attempts: 1 Placement Confirmation: positive ETCO2 Dental Injury: Teeth and Oropharynx as per pre-operative assessment

## 2024-08-09 NOTE — Anesthesia Postprocedure Evaluation (Signed)
"   Anesthesia Post Note  Patient: Rhonda Hawkins  Procedure(s) Performed: CYSTOSCOPY/URETEROSCOPY/STENT REMOVAL (Right: Renal)     Patient location during evaluation: PACU Anesthesia Type: General Level of consciousness: awake and alert, oriented and patient cooperative Pain management: pain level controlled Vital Signs Assessment: post-procedure vital signs reviewed and stable Respiratory status: spontaneous breathing, nonlabored ventilation and respiratory function stable Cardiovascular status: blood pressure returned to baseline and stable Postop Assessment: no apparent nausea or vomiting Anesthetic complications: no   No notable events documented.  Last Vitals:  Vitals:   08/09/24 1100 08/09/24 1115  BP: (!) 99/57 98/67  Pulse: (!) 110 (!) 107  Resp: 20 18  Temp:    SpO2: 98% 99%    Last Pain:  Vitals:   08/09/24 1100  TempSrc:   PainSc: 0-No pain                 Almarie HERO Keagan Brislin      "

## 2024-08-09 NOTE — Transfer of Care (Signed)
 Immediate Anesthesia Transfer of Care Note  Patient: Rhonda Hawkins  Procedure(s) Performed: CYSTOSCOPY/URETEROSCOPY/STENT REMOVAL (Right: Renal)  Patient Location: PACU  Anesthesia Type:General  Level of Consciousness: awake, alert , oriented, and patient cooperative  Airway & Oxygen Therapy: Patient Spontanous Breathing and Patient connected to face mask oxygen  Post-op Assessment: Report given to RN and Post -op Vital signs reviewed and stable  Post vital signs: Reviewed and stable  Last Vitals:  Vitals Value Taken Time  BP 120/73 08/09/24 10:26  Temp    Pulse 113 08/09/24 10:28  Resp 18 08/09/24 10:28  SpO2 100 % 08/09/24 10:28  Vitals shown include unfiled device data.  Last Pain:  Vitals:   08/09/24 0749  TempSrc: Oral  PainSc: 5       Patients Stated Pain Goal: 4 (08/09/24 0749)  Complications: No notable events documented.

## 2024-08-09 NOTE — Anesthesia Preprocedure Evaluation (Addendum)
"                                    Anesthesia Evaluation  Patient identified by MRN, date of birth, ID band Patient awake    Reviewed: Allergy & Precautions, NPO status , Patient's Chart, lab work & pertinent test results  Airway Mallampati: III  TM Distance: >3 FB Neck ROM: Full    Dental  (+) Teeth Intact, Dental Advisory Given   Pulmonary neg pulmonary ROS   Pulmonary exam normal breath sounds clear to auscultation       Cardiovascular negative cardio ROS Normal cardiovascular exam Rhythm:Regular Rate:Normal     Neuro/Psych  PSYCHIATRIC DISORDERS Anxiety Depression    negative neurological ROS     GI/Hepatic negative GI ROS, Neg liver ROS,,,  Endo/Other  diabetes, Well Controlled, Type 2, Oral Hypoglycemic AgentsHypothyroidism  Last A1c 9.8, FS 355 in preop this AM- treated w/ 12 units insulin  Admitted nov 2025 for kidney stone and stent placed, glucoses >400 during that stay and has since started two PO meds for T2DM  Renal/GU Renal disease (ureteral calculus)  negative genitourinary   Musculoskeletal negative musculoskeletal ROS (+)    Abdominal   Peds  Hematology negative hematology ROS (+)   Anesthesia Other Findings   Reproductive/Obstetrics negative OB ROS S/p hysterectomy 2021                              Anesthesia Physical Anesthesia Plan  ASA: 2  Anesthesia Plan: General   Post-op Pain Management: Tylenol  PO (pre-op)* and Toradol  IV (intra-op)*   Induction: Intravenous  PONV Risk Score and Plan: 4 or greater and Dexamethasone, Midazolam , Ondansetron  and Treatment may vary due to age or medical condition  Airway Management Planned: LMA  Additional Equipment: None  Intra-op Plan:   Post-operative Plan: Extubation in OR  Informed Consent: I have reviewed the patients History and Physical, chart, labs and discussed the procedure including the risks, benefits and alternatives for the proposed  anesthesia with the patient or authorized representative who has indicated his/her understanding and acceptance.     Dental advisory given  Plan Discussed with: CRNA  Anesthesia Plan Comments:          Anesthesia Quick Evaluation  "

## 2024-08-09 NOTE — Interval H&P Note (Signed)
 History and Physical Interval Note:  08/09/2024 9:03 AM  Rhonda Hawkins  has presented today for surgery, with the diagnosis of RIGHT URETERAL STONE.  The various methods of treatment have been discussed with the patient and family. After consideration of risks, benefits and other options for treatment, the patient has consented to  Procedures: CYSTOSCOPY/URETEROSCOPY/HOLMIUM LASER/STENT PLACEMENT (Right) as a surgical intervention.  The patient's history has been reviewed, patient examined, no change in status, stable for surgery.  I have reviewed the patient's chart and labs.  Questions were answered to the patient's satisfaction.     [name]

## 2024-08-10 ENCOUNTER — Encounter (HOSPITAL_COMMUNITY): Payer: Self-pay | Admitting: Urology
